# Patient Record
Sex: Female | Born: 1990 | Hispanic: Yes | Marital: Single | Smoking: Never smoker
Health system: Southern US, Community
[De-identification: ages and names within clinical notes are randomized; demographics above are authoritative.]

## PROBLEM LIST (undated history)

## (undated) ENCOUNTER — Inpatient Hospital Stay (HOSPITAL_COMMUNITY): Payer: Self-pay

## (undated) DIAGNOSIS — B999 Unspecified infectious disease: Secondary | ICD-10-CM

## (undated) DIAGNOSIS — L309 Dermatitis, unspecified: Secondary | ICD-10-CM

## (undated) DIAGNOSIS — F32A Depression, unspecified: Secondary | ICD-10-CM

## (undated) DIAGNOSIS — F329 Major depressive disorder, single episode, unspecified: Secondary | ICD-10-CM

## (undated) DIAGNOSIS — N390 Urinary tract infection, site not specified: Secondary | ICD-10-CM

## (undated) DIAGNOSIS — E119 Type 2 diabetes mellitus without complications: Secondary | ICD-10-CM

## (undated) HISTORY — DX: Dermatitis, unspecified: L30.9

## (undated) HISTORY — PX: BIOPSY BREAST: PRO8

## (undated) HISTORY — PX: WISDOM TOOTH EXTRACTION: SHX21

## (undated) HISTORY — DX: Type 2 diabetes mellitus without complications: E11.9

---
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
## 2010-01-07 ENCOUNTER — Inpatient Hospital Stay (HOSPITAL_COMMUNITY): Admission: AD | Admit: 2010-01-07 | Discharge: 2010-01-07 | Payer: Self-pay | Admitting: Obstetrics and Gynecology

## 2010-02-13 ENCOUNTER — Inpatient Hospital Stay (HOSPITAL_COMMUNITY): Admission: AD | Admit: 2010-02-13 | Discharge: 2010-02-13 | Payer: Self-pay | Admitting: Obstetrics and Gynecology

## 2010-02-23 ENCOUNTER — Inpatient Hospital Stay (HOSPITAL_COMMUNITY): Admission: AD | Admit: 2010-02-23 | Discharge: 2010-02-25 | Payer: Self-pay | Admitting: Obstetrics and Gynecology

## 2011-03-01 LAB — WET PREP, GENITAL
Clue Cells Wet Prep HPF POC: NONE SEEN
Trich, Wet Prep: NONE SEEN

## 2011-03-09 LAB — CBC
Hemoglobin: 11.3 g/dL — ABNORMAL LOW (ref 12.0–15.0)
MCHC: 33.2 g/dL (ref 30.0–36.0)
MCHC: 33.3 g/dL (ref 30.0–36.0)
MCV: 91.8 fL (ref 78.0–100.0)
Platelets: 146 10*3/uL — ABNORMAL LOW (ref 150–400)
RBC: 3.64 MIL/uL — ABNORMAL LOW (ref 3.87–5.11)
RBC: 4.27 MIL/uL (ref 3.87–5.11)
RDW: 13.2 % (ref 11.5–15.5)

## 2011-03-21 LAB — URINALYSIS, ROUTINE W REFLEX MICROSCOPIC
Hgb urine dipstick: NEGATIVE
Nitrite: NEGATIVE
Protein, ur: NEGATIVE mg/dL
Specific Gravity, Urine: 1.015 (ref 1.005–1.030)
Urobilinogen, UA: 0.2 mg/dL (ref 0.0–1.0)

## 2011-03-21 LAB — COMPREHENSIVE METABOLIC PANEL
ALT: 11 U/L (ref 0–35)
AST: 19 U/L (ref 0–37)
Albumin: 4 g/dL (ref 3.5–5.2)
Calcium: 9.5 mg/dL (ref 8.4–10.5)
GFR calc Af Amer: >60 mL/min (ref >60)
Glucose, Bld: 98 mg/dL (ref 70–99)
Sodium: 134 mEq/L — ABNORMAL LOW (ref 135–145)
Total Protein: 7.6 g/dL (ref 6.0–8.3)

## 2011-03-21 LAB — WET PREP, GENITAL

## 2011-03-21 LAB — CBC
Platelets: 175 10*3/uL (ref 150–400)
RBC: 3.91 MIL/uL (ref 3.87–5.11)
WBC: 9.5 10*3/uL (ref 4.0–10.5)

## 2011-03-21 LAB — GC/CHLAMYDIA PROBE AMP, GENITAL
Chlamydia, DNA Probe: NEGATIVE
GC Probe Amp, Genital: NEGATIVE

## 2011-03-26 LAB — GC/CHLAMYDIA PROBE AMP, URINE
Chlamydia, Swab/Urine, PCR: POSITIVE — AB
GC Probe Amp, Urine: NEGATIVE

## 2011-03-26 LAB — URINALYSIS, ROUTINE W REFLEX MICROSCOPIC
Bilirubin Urine: NEGATIVE
Glucose, UA: NEGATIVE mg/dL
Ketones, ur: 15 mg/dL — AB
Specific Gravity, Urine: >1.03 — ABNORMAL HIGH (ref 1.005–1.030)
pH: 6 (ref 5.0–8.0)

## 2011-03-26 LAB — URINE MICROSCOPIC-ADD ON

## 2011-03-26 LAB — POCT PREGNANCY, URINE: Preg Test, Ur: NEGATIVE

## 2011-04-27 ENCOUNTER — Emergency Department (HOSPITAL_COMMUNITY)
Admission: EM | Admit: 2011-04-27 | Discharge: 2011-04-27 | Disposition: A | Discharge status: Home / Self Care | Payer: Medicaid Other | Attending: Emergency Medicine | Admitting: Emergency Medicine

## 2011-04-27 DIAGNOSIS — J069 Acute upper respiratory infection, unspecified: Secondary | ICD-10-CM | POA: Insufficient documentation

## 2011-04-27 DIAGNOSIS — J029 Acute pharyngitis, unspecified: Secondary | ICD-10-CM | POA: Insufficient documentation

## 2011-04-27 LAB — RAPID STREP SCREEN (MED CTR MEBANE ONLY): Streptococcus, Group A Screen (Direct): NEGATIVE

## 2012-04-12 ENCOUNTER — Telehealth: Payer: Self-pay

## 2012-04-12 NOTE — Telephone Encounter (Signed)
Routed to triage 

## 2012-04-13 ENCOUNTER — Telehealth: Payer: Self-pay | Admitting: Obstetrics and Gynecology

## 2012-04-13 NOTE — Telephone Encounter (Signed)
Spoke with pt rgd msg. Pt stated she had the iud removed 01/18/12. She stated she had one missed cycle . Pt stated that took pregnancy test and it was neg. Advised pt to use back up method and told pt if no cycle by may 20 to take a pregnancy test. Pt stated she wants to get her cycle back to normal.pt does not want b/c at this time. bt cma

## 2012-04-13 NOTE — Telephone Encounter (Signed)
Lm on vm to cb per telephone call.  

## 2012-04-13 NOTE — Telephone Encounter (Signed)
Left msg on pt's voicemail to call bach rgd msg. bt cma

## 2012-05-20 ENCOUNTER — Ambulatory Visit: Payer: Self-pay

## 2012-06-21 ENCOUNTER — Ambulatory Visit: Payer: Medicaid Other

## 2012-07-19 ENCOUNTER — Ambulatory Visit: Payer: Self-pay

## 2012-11-30 ENCOUNTER — Ambulatory Visit (INDEPENDENT_AMBULATORY_CARE_PROVIDER_SITE_OTHER): Payer: Self-pay

## 2012-11-30 VITALS — BP 102/64 | Wt 123.0 lb

## 2012-11-30 DIAGNOSIS — R399 Unspecified symptoms and signs involving the genitourinary system: Secondary | ICD-10-CM

## 2012-11-30 DIAGNOSIS — B373 Candidiasis of vulva and vagina: Secondary | ICD-10-CM

## 2012-11-30 DIAGNOSIS — R3989 Other symptoms and signs involving the genitourinary system: Secondary | ICD-10-CM

## 2012-11-30 LAB — POCT URINALYSIS DIPSTICK
Spec Grav, UA: 1.015
pH, UA: 5

## 2012-11-30 MED ORDER — FLUCONAZOLE 200 MG PO TABS: 200.0000 mg | ORAL_TABLET | Freq: Once | ORAL | Status: DC

## 2012-11-30 NOTE — Progress Notes (Signed)
Contraception: condoms History of STD:  no history of PID, STD's History of ovarian cyst: no History of fibroids: no History of endometriosis:no Previous ultrasound:no  Urinary symptoms: urinary incontinence Gastro-intestinal symptoms:  Constipation: no     Diarrhea: no     Nausea: no     Vomiting: no     Fever: no Vaginal discharge: no vaginal discharge Pt stated she feels like she has a UTI symptoms with odor in urine    S:  21y.o. SHF P1001 who presents w/ CC of concentrated urine intermittently for several months and odor to urine.  Denies fever.  No pain w/ urination, sometimes urgency; no frequency.  Occ'l mid-back pain.  Drinks primarily water.  No contraception currently.  Took Depo 2/'13 and none since.  Using condoms, but isn't currently sexually active and declines contraception at present.  No GI c/o's.  Periods have been irregular, primarily longer lengths since DMPA.   O:  .Marland Kitchen Filed Vitals:   11/30/12 0845  BP: 102/64  Weight: 123 lb (55.792 kg)  .Marland Kitchen Results for orders placed in visit on 11/30/12 (from the past 24 hour(s))  POCT URINALYSIS DIPSTICK     Status: Normal   Collection Time   11/30/12  8:55 AM      Component Value Range   Color, UA yellow     Clarity, UA       Glucose, UA neg     Bilirubin, UA 1     Ketones, UA tr     Spec Grav, UA 1.015     Blood, UA tr     pH, UA 5.0     Protein, UA tr     Urobilinogen, UA negative     Nitrite, UA neg     Leukocytes, UA Trace    PE:  Gen: NAD, A&Ox3          Abd:  Soft, NT, no organomegaly          No CVAT today         Genitalia:  Very small amt of white d/c in vault; no irritation or inflammation.  No odor.  Ut nml size and shape.  No lesions.  EGBUS wnl; Adnexa WNL.  Wet prep:  Ph=4, yeast only on wet prep  A:  Yeast UTI suspected /vaginitis       No contraception and declines  P:  Urine sent for C&S; Diflucan 200mg  po x1 today (#2 RF); if s/s don't improve, refer to urology prn       Pap/Aex in Feb if  desires or May during birthday month

## 2012-12-02 LAB — URINE CULTURE: Colony Count: 9000

## 2013-01-09 ENCOUNTER — Telehealth: Payer: Self-pay | Admitting: Obstetrics and Gynecology

## 2013-01-09 NOTE — Telephone Encounter (Signed)
Tc to pt rgd lab results. Pt never received results of urine cx done 11/30/12. Advised pt that results were negative, pt states she is still having odor with her urine. Advised pt to increase water intake, try to drink cranberry juice, and to call the office back for an appointment if sxs still persist. Pt voiced understanding.

## 2013-01-30 ENCOUNTER — Telehealth: Payer: Self-pay | Admitting: Obstetrics and Gynecology

## 2013-01-31 ENCOUNTER — Encounter: Payer: Self-pay | Admitting: Family Medicine

## 2013-01-31 ENCOUNTER — Ambulatory Visit: Payer: Self-pay | Admitting: Family Medicine

## 2013-01-31 VITALS — BP 100/70 | Ht 65.0 in | Wt 127.0 lb

## 2013-01-31 DIAGNOSIS — N898 Other specified noninflammatory disorders of vagina: Secondary | ICD-10-CM

## 2013-01-31 DIAGNOSIS — Z124 Encounter for screening for malignant neoplasm of cervix: Secondary | ICD-10-CM

## 2013-01-31 LAB — POCT WET PREP (WET MOUNT)
Clue Cells Wet Prep Whiff POC: NEGATIVE
Trichomonas Wet Prep HPF POC: NEGATIVE
WBC, Wet Prep HPF POC: NEGATIVE
pH: 4.5

## 2013-01-31 NOTE — Telephone Encounter (Signed)
Pt states that she has had brown d/c with no odor. No other sx. Pt is not currently on BC. Had negative UPT on 01/26/13 Pt scheduled with LC, NP today to evaluate d/c. Was advised she would need to pay $150 at check-in  Darien Ramus, [place]

## 2013-01-31 NOTE — Patient Instructions (Signed)
1. Repeat home PT if no menses on 2/28. 2. Record monthly menses, if no menses in 3 months call office may need further evaluation.

## 2013-01-31 NOTE — Progress Notes (Signed)
Subjective:     Patient ID: Linda Huber, female   DOB: 1991/10/04, 22 y.o.   MRN: [id number] BP 100/70  Ht 5\' 5"  (1.651 m)  Wt 127 lb (57.607 kg)  BMI 21.13 kg/m2  LMP 01/10/2013   Gynecologic Exam The patient's primary symptoms include a vaginal discharge. This is a new problem. The current episode started in the past 7 days. The problem occurs rarely. The problem has been resolved. The patient is experiencing no pain. She is not pregnant. Pertinent negatives include no abdominal pain, nausea or vomiting. The vaginal discharge was bloody, clear and brown. There has been no bleeding. Nothing aggravates the symptoms. She has tried nothing for the symptoms. She is sexually active. No, her partner does not have an STD. She uses condoms for contraception. Her menstrual history has been regular.   Bleeding x 1 on 2/13. Then spotting. Condoms are used intermittently.  Home PT negative 2/13.  No vaginal itchy, irration.    Review of Systems  Constitutional: Negative for activity change and unexpected weight change.  HENT: Negative for neck pain and voice change.   Gastrointestinal: Negative for nausea, vomiting, abdominal pain and anal bleeding.  Endocrine: Negative.   Genitourinary: Positive for vaginal discharge. Negative for vaginal bleeding, vaginal pain, menstrual problem and dyspareunia.   No change in stress, exercise routine.  Denies history of BV, Yeast, STDs.    Objective:   Physical Exam  Constitutional: She appears well-developed and well-nourished.  Neck: Normal range of motion. No mass and no thyromegaly present.  Genitourinary: Uterus normal. Vaginal discharge found.  Lymphadenopathy:       Right: No inguinal adenopathy present.       Left: No inguinal adenopathy present.   Vagina: No hair present. WNL except small amount of thin, clear odorless vaginal discharge.  Cervix: WNL with a nabothain cyst at 11 o'clock.  NO CMT.  BSU: WNL. Adnexa: no tenderness or mass.       Assessment:    1. Wet Mount: negative, pH 4.5   2. Patient declines office PT today.   3. Pap with GC/CT culture collected .    Plan:   1. Leukorrhea: no treatment indicated.  2. Discussed with patient may have been irregular spotting with unknown cause vs. Change in                 menses.  Patient to continue monthly menses charting, if no menses on 2/28, need a home PT.  If      amenorrhea occurs for 3 months need to call the office for further work up.

## 2013-02-02 LAB — PAP IG, CT-NG, RFX HPV ASCU
Chlamydia Probe Amp: NEGATIVE
GC Probe Amp: NEGATIVE

## 2013-02-03 LAB — HUMAN PAPILLOMAVIRUS, HIGH RISK: HPV DNA High Risk: DETECTED — AB

## 2013-02-06 ENCOUNTER — Encounter: Payer: Self-pay | Admitting: Family Medicine

## 2013-02-22 ENCOUNTER — Telehealth: Payer: Self-pay | Admitting: Obstetrics and Gynecology

## 2013-02-22 NOTE — Telephone Encounter (Signed)
TC to pt Regarding pap results. LMOVM for pt to call back.   [facility] CMA

## 2013-12-01 ENCOUNTER — Emergency Department (HOSPITAL_COMMUNITY): Payer: 59

## 2013-12-01 ENCOUNTER — Encounter (HOSPITAL_COMMUNITY): Payer: Self-pay | Admitting: Emergency Medicine

## 2013-12-01 ENCOUNTER — Emergency Department (HOSPITAL_COMMUNITY)
Admission: EM | Admit: 2013-12-01 | Discharge: 2013-12-02 | Disposition: A | Discharge status: Home / Self Care | Payer: 59 | Attending: Emergency Medicine | Admitting: Emergency Medicine

## 2013-12-01 DIAGNOSIS — R0602 Shortness of breath: Secondary | ICD-10-CM | POA: Insufficient documentation

## 2013-12-01 DIAGNOSIS — R059 Cough, unspecified: Secondary | ICD-10-CM | POA: Insufficient documentation

## 2013-12-01 DIAGNOSIS — R509 Fever, unspecified: Secondary | ICD-10-CM | POA: Insufficient documentation

## 2013-12-01 DIAGNOSIS — J3489 Other specified disorders of nose and nasal sinuses: Secondary | ICD-10-CM | POA: Insufficient documentation

## 2013-12-01 DIAGNOSIS — J029 Acute pharyngitis, unspecified: Secondary | ICD-10-CM | POA: Insufficient documentation

## 2013-12-01 DIAGNOSIS — R110 Nausea: Secondary | ICD-10-CM | POA: Insufficient documentation

## 2013-12-01 DIAGNOSIS — R05 Cough: Secondary | ICD-10-CM

## 2013-12-01 NOTE — ED Provider Notes (Signed)
CSN: [id number]     Arrival date & time 12/01/13  2229 History   First MD Initiated Contact with Patient 12/01/13 2338     Chief Complaint  Patient presents with  . Cough   (Consider location/radiation/quality/duration/timing/severity/associated sxs/prior Treatment) Patient is a 22 y.o. female presenting with cough. The history is provided by the patient. No language interpreter was used.  Cough Cough characteristics:  Productive Sputum characteristics:  Clear and green Severity:  Mild Onset quality:  Gradual Duration:  4 weeks Timing:  Intermittent Progression:  Waxing and waning Chronicity:  New Smoker: no   Context: upper respiratory infection   Context: not smoke exposure   Relieved by:  Nothing Worsened by:  Deep breathing (worse at nighttime) Ineffective treatments:  Cough suppressants and fluids (cough drops) Associated symptoms: fever (subjective), shortness of breath (with prolonged coughing), sinus congestion and sore throat   Associated symptoms: no ear pain, no eye discharge, no myalgias, no rash and no rhinorrhea     History reviewed. No pertinent past medical history. Past Surgical History  Procedure Laterality Date  . Biopsy breast     Family History  Problem Relation Age of Onset  . Leukemia Paternal Grandmother   . Stroke Maternal Grandfather    History  Substance Use Topics  . Smoking status: Never Smoker   . Smokeless tobacco: Never Used  . Alcohol Use: Yes   OB History   Grav Para Term Preterm Abortions TAB SAB Ect Mult Living   1 1 1       1      Review of Systems  Constitutional: Positive for fever (subjective).  HENT: Positive for congestion, postnasal drip and sore throat. Negative for drooling, ear pain, rhinorrhea and trouble swallowing.   Eyes: Negative for discharge.  Respiratory: Positive for cough and shortness of breath (with prolonged coughing).   Gastrointestinal: Positive for nausea. Negative for vomiting.  Musculoskeletal:  Negative for myalgias.  Skin: Negative for rash.  All other systems reviewed and are negative.    Allergies  Review of patient's allergies indicates no known allergies.  Home Medications   Current Outpatient Rx  Name  Route  Sig  Dispense  Refill  . guaifenesin (ROBITUSSIN) 100 MG/5ML syrup   Oral   Take 200 mg by mouth 3 (three) times daily as needed for cough.         . benzonatate (TESSALON) 100 MG capsule   Oral   Take 2 capsules (200 mg total) by mouth 2 (two) times daily as needed for cough.   20 capsule   0   . fluticasone (FLONASE) 50 MCG/ACT nasal spray   Each Nare   Place 2 sprays into both nostrils daily.   16 g   0   . predniSONE (DELTASONE) 20 MG tablet   Oral   Take 2 tablets (40 mg total) by mouth daily.   10 tablet   0    BP 127/97  Pulse 73  Temp(Src) 97.7 F (36.5 C) (Oral)  Resp 18  SpO2 97%  LMP 11/23/2013  Physical Exam  Nursing note and vitals reviewed. Constitutional: She is oriented to person, place, and time. She appears well-developed and well-nourished. No distress.  Patient well and nontoxic appearing, in no acute distress. She is fully made up with make up as well as curled/styled hair.  HENT:  Head: Normocephalic and atraumatic.  Right Ear: Tympanic membrane, external ear and ear canal normal.  Left Ear: Tympanic membrane, external ear and ear canal  normal.  Nose: Right sinus exhibits frontal sinus tenderness. Right sinus exhibits no maxillary sinus tenderness. Left sinus exhibits frontal sinus tenderness. Left sinus exhibits no maxillary sinus tenderness.  Mouth/Throat: Uvula is midline, oropharynx is clear and moist and mucous membranes are normal. No oropharyngeal exudate.  Eyes: Conjunctivae and EOM are normal. Pupils are equal, round, and reactive to light. No scleral icterus.  Neck: Normal range of motion. Neck supple.  Cardiovascular: Normal rate, regular rhythm and normal heart sounds.   Pulmonary/Chest: Effort normal  and breath sounds normal. No stridor. No respiratory distress. She has no wheezes. She has no rales.  No retractions or accessory muscle use. Symmetric chest expansion.  Abdominal: Soft. She exhibits no distension. There is no tenderness. There is no rebound and no guarding.  Musculoskeletal: Normal range of motion.  Neurological: She is alert and oriented to person, place, and time.  Skin: Skin is warm and dry. No rash noted. She is not diaphoretic. No erythema. No pallor.  Psychiatric: She has a normal mood and affect. Her behavior is normal.    ED Course  Procedures (including critical care time) Labs Review Labs Reviewed - No data to display Imaging Review Dg Chest 2 View  12/02/2013   CLINICAL DATA:  Cough, fever and chills.  EXAM: CHEST  2 VIEW  COMPARISON:  None.  FINDINGS: The cardiomediastinal silhouette is unremarkable.  The lungs are clear.  There is no evidence of focal airspace disease, pulmonary edema, suspicious pulmonary nodule/mass, pleural effusion, or pneumothorax. No acute bony abnormalities are identified.  IMPRESSION: No active cardiopulmonary disease.   Electronically Signed   By: Laveda Abbe M.D.   On: 12/02/2013 00:00    EKG Interpretation   None       MDM   1. Cough    22 year old female presents for cough x1 month. Patient is well and nontoxic appearing, hemodynamically stable, and afebrile. Lungs clear to auscultation bilaterally. No retractions or accessory muscle use noted. Symmetric chest expansion. Uvula midline and patient tolerating secretions without difficulty. Chest x-ray negative for pneumonia or bronchitis. Doubt atypically presenting pulmonary embolism; patient PERC negative. Symptoms most c/w viral illness. Patient stable and appropriate for discharge with prescriptions for Flonase, Tessalon, and Prednisone. Return precautions provided and patient agreeable to plan with no unaddressed concerns.    Antony Madura, [place] 12/02/13 [phone]

## 2013-12-01 NOTE — ED Notes (Signed)
Pt. reports persistent cough for 1 month , denies SOB or chest pain , no fever or chills.

## 2013-12-02 MED ORDER — PREDNISONE 20 MG PO TABS: 40.0000 mg | ORAL_TABLET | Freq: Every day | ORAL | Status: DC

## 2013-12-02 MED ORDER — BENZONATATE 100 MG PO CAPS: 200.0000 mg | ORAL_CAPSULE | Freq: Two times a day (BID) | ORAL | Status: DC | PRN

## 2013-12-02 MED ORDER — FLUTICASONE PROPIONATE 50 MCG/ACT NA SUSP: 2.0000 | Freq: Every day | NASAL | Status: DC

## 2013-12-02 NOTE — ED Provider Notes (Signed)
Medical screening examination/treatment/procedure(s) were performed by non-physician practitioner and as supervising physician I was immediately available for consultation/collaboration.    Olivia Mackie, MD 12/02/13 [phone]

## 2013-12-14 NOTE — L&D Delivery Note (Signed)
Final Labor Progress Note At 1920 pt reports an increased in rectal pressure.  VE C/C/+2.  FHR remained reassuring with occasional variable decelerations with quick recovery to baseline at rest.     Vaginal Delivery Note The pt utilized an epidural as pain management.   Spontaneous rupture of membranes today, at 0700, clear.  GBS was unknown, Pt was treated prophylacticly with PCN x 3 doses were given.  Cervical dilation was complete at  1925.  NICHD Category 2.    Pushing with guidance began at  1930.   After 3 minutes of pushing the head, shoulders and the body of a viable female infant "[name]" delivered spontaneously with maternal effort in the LOA position at 1933.   Loose [place] x 1, easily reduced.   With vigorous tone and spontaneous cry, the infant was placed on moms abd.  After the umbilical cord was clamped it was cut by the FOB, then cord blood was obtained for evaluation.  Spontaneous delivery of a intact placenta with a 3 vessel cord via [place] at 1936.   Episiotomy: None   The vulva, perineum, vaginal vault, rectum and cervix were inspected, no repairs needed.   Postpartum pitocin as ordered.  Fundus firm, lochia minimum, bleeding under control.   EBL 200, Pt hemodynamically stable.   Sponge, laps and needle count correct and verified with the primary care nurse.  Attending MD available at all times.    Mom and baby were left in stable condition, baby skin to skin. Routine postpartum orders   Mother unsure about method of contraception Or Mother desires Mirena for contraception   Placenta to pathology: NO     Cord Gases sent to lab: NO Cord blood sent to lab: YES   APGARS:  9 at 1 minute and 9 at 5 minutes Weight:. TBD     Linda Huber, CNM, MSN 11/08/2014. 8:03 PM

## 2014-02-10 ENCOUNTER — Inpatient Hospital Stay (HOSPITAL_COMMUNITY)
Admission: AD | Admit: 2014-02-10 | Discharge: 2014-02-10 | Disposition: A | Discharge status: Home / Self Care | Payer: Self-pay | Source: Ambulatory Visit | Attending: Obstetrics and Gynecology | Admitting: Obstetrics and Gynecology

## 2014-02-10 ENCOUNTER — Encounter (HOSPITAL_COMMUNITY): Payer: Self-pay | Admitting: *Deleted

## 2014-02-10 ENCOUNTER — Inpatient Hospital Stay (HOSPITAL_COMMUNITY): Payer: Medicaid Other

## 2014-02-10 DIAGNOSIS — O200 Threatened abortion: Secondary | ICD-10-CM | POA: Insufficient documentation

## 2014-02-10 DIAGNOSIS — O2390 Unspecified genitourinary tract infection in pregnancy, unspecified trimester: Secondary | ICD-10-CM | POA: Insufficient documentation

## 2014-02-10 DIAGNOSIS — N390 Urinary tract infection, site not specified: Secondary | ICD-10-CM | POA: Insufficient documentation

## 2014-02-10 LAB — CBC
HCT: 35.5 % — ABNORMAL LOW (ref 36.0–46.0)
Hemoglobin: 12.3 g/dL (ref 12.0–15.0)
MCH: 29.4 pg (ref 26.0–34.0)
MCHC: 34.6 g/dL (ref 30.0–36.0)
MCV: 84.7 fL (ref 78.0–100.0)
Platelets: 201 10*3/uL (ref 150–400)
RBC: 4.19 MIL/uL (ref 3.87–5.11)
RDW: 12.8 % (ref 11.5–15.5)
WBC: 6.6 10*3/uL (ref 4.0–10.5)

## 2014-02-10 LAB — POCT PREGNANCY, URINE: Preg Test, Ur: POSITIVE — AB

## 2014-02-10 LAB — URINALYSIS, ROUTINE W REFLEX MICROSCOPIC
Bilirubin Urine: NEGATIVE
Glucose, UA: NEGATIVE mg/dL
KETONES UR: NEGATIVE mg/dL
LEUKOCYTES UA: NEGATIVE
NITRITE: POSITIVE — AB
PH: 6 (ref 5.0–8.0)
PROTEIN: NEGATIVE mg/dL
Specific Gravity, Urine: 1.015 (ref 1.005–1.030)
Urobilinogen, UA: 0.2 mg/dL (ref 0.0–1.0)

## 2014-02-10 LAB — WET PREP, GENITAL
Clue Cells Wet Prep HPF POC: NONE SEEN
Trich, Wet Prep: NONE SEEN
Yeast Wet Prep HPF POC: NONE SEEN

## 2014-02-10 LAB — ABO/RH: ABO/RH(D): AB POS

## 2014-02-10 LAB — URINE MICROSCOPIC-ADD ON

## 2014-02-10 LAB — HCG, QUANTITATIVE, PREGNANCY: HCG, BETA CHAIN, QUANT, S: 3844 m[IU]/mL — AB (ref <5)

## 2014-02-10 MED ORDER — OXYCODONE-ACETAMINOPHEN 5-325 MG PO TABS
1.0000 | ORAL_TABLET | Freq: Once | ORAL | Status: AC
  Administered 2014-02-10: 1 via ORAL
  Filled 2014-02-10: qty 1

## 2014-02-10 MED ORDER — OXYCODONE-ACETAMINOPHEN 5-325 MG PO TABS
2.0000 | ORAL_TABLET | ORAL | Status: DC | PRN
Start: 2014-02-10 — End: 2014-06-04

## 2014-02-10 MED ORDER — PROMETHAZINE HCL 25 MG PO TABS
25.0000 mg | ORAL_TABLET | Freq: Once | ORAL | Status: AC
  Administered 2014-02-10: 25 mg via ORAL
  Filled 2014-02-10: qty 1

## 2014-02-10 MED ORDER — PROMETHAZINE HCL 25 MG PO TABS: 25.0000 mg | ORAL_TABLET | Freq: Four times a day (QID) | ORAL | Status: DC | PRN

## 2014-02-10 MED ORDER — CEPHALEXIN 500 MG PO CAPS: 500.0000 mg | ORAL_CAPSULE | Freq: Four times a day (QID) | ORAL | Status: DC

## 2014-02-10 NOTE — Discharge Instructions (Signed)
Urinary Tract Infection Urinary tract infections (UTIs) can develop anywhere along your urinary tract. Your urinary tract is your body's drainage system for removing wastes and extra water. Your urinary tract includes two kidneys, two ureters, a bladder, and a urethra. Your kidneys are a pair of bean-shaped organs. Each kidney is about the size of your fist. They are located below your ribs, one on each side of your spine. CAUSES Infections are caused by microbes, which are microscopic organisms, including fungi, viruses, and bacteria. These organisms are so small that they can only be seen through a microscope. Bacteria are the microbes that most commonly cause UTIs. SYMPTOMS  Symptoms of UTIs may vary by age and gender of the patient and by the location of the infection. Symptoms in young women typically include a frequent and intense urge to urinate and a painful, burning feeling in the bladder or urethra during urination. Older women and men are more likely to be tired, shaky, and weak and have muscle aches and abdominal pain. A fever may mean the infection is in your kidneys. Other symptoms of a kidney infection include pain in your back or sides below the ribs, nausea, and vomiting. DIAGNOSIS To diagnose a UTI, your caregiver will ask you about your symptoms. Your caregiver also will ask to provide a urine sample. The urine sample will be tested for bacteria and white blood cells. White blood cells are made by your body to help fight infection. TREATMENT  Typically, UTIs can be treated with medication. Because most UTIs are caused by a bacterial infection, they usually can be treated with the use of antibiotics. The choice of antibiotic and length of treatment depend on your symptoms and the type of bacteria causing your infection. HOME CARE INSTRUCTIONS  If you were prescribed antibiotics, take them exactly as your caregiver instructs you. Finish the medication even if you feel better after you  have only taken some of the medication.  Drink enough water and fluids to keep your urine clear or pale yellow.  Avoid caffeine, tea, and carbonated beverages. They tend to irritate your bladder.  Empty your bladder often. Avoid holding urine for long periods of time.  Empty your bladder before and after sexual intercourse.  After a bowel movement, women should cleanse from front to back. Use each tissue only once. SEEK MEDICAL CARE IF:   You have back pain.  You develop a fever.  Your symptoms do not begin to resolve within 3 days. SEEK IMMEDIATE MEDICAL CARE IF:   You have severe back pain or lower abdominal pain.  You develop chills.  You have nausea or vomiting.  You have continued burning or discomfort with urination. MAKE SURE YOU:   Understand these instructions.  Will watch your condition.  Will get help right away if you are not doing well or get worse. Document Released: 09/09/2005 Document Revised: 05/31/2012 Document Reviewed: 01/08/2012 [facility] Patient Information 2014 [place], [place]. Threatened Miscarriage Bleeding during the first 20 weeks of pregnancy is common. This is sometimes called a threatened miscarriage. This is a pregnancy that is threatening to end before the twentieth week of pregnancy. Often this bleeding stops with bed rest or decreased activities as suggested by your caregiver and the pregnancy continues without any more problems. You may be asked to not have sexual intercourse, have orgasms or use tampons until further notice. Sometimes a threatened miscarriage can progress to a complete or incomplete miscarriage. This may or may not require further treatment. Some miscarriages  occur before a woman misses a menstrual period and knows she is pregnant. Miscarriages occur in 15 to 20% of all pregnancies and usually occur during the first 13 weeks of the pregnancy. The exact cause of a miscarriage is usually never known. A miscarriage is natures way  of ending a pregnancy that is abnormal or would not make it to term. There are some things that may put you at risk to have a miscarriage, such as:  Hormone problems.  Infection of the uterus or cervix.  Chronic illness, diabetes for example, especially if it is not controlled.  Abnormal shaped uterus.  Fibroids in the uterus.  Incompetent cervix (the cervix is too weak to hold the baby).  Smoking.  Drinking too much alcohol. It's best not to drink any alcohol when you are pregnant.  Taking illegal drugs. TREATMENT  When a miscarriage becomes complete and all products of conception (all the tissue in the uterus) have been passed, often no treatment is needed. If you think you passed tissue, save it in a container and take it to your doctor for evaluation. If the miscarriage is incomplete (parts of the fetus or placenta remain in the uterus), further treatment may be needed. The most common reason for further treatment is continued bleeding (hemorrhage) because pregnancy tissue did not pass out of the uterus. This often occurs if a miscarriage is incomplete. Tissue left behind may also become infected. Treatment usually is dilatation and curettage (the removal of the remaining products of pregnancy. This can be done by a simple sucking procedure (suction curettage) or a simple scraping of the inside of the uterus. This may be done in the hospital or in the caregiver's office. This is only done when your caregiver knows that there is no chance for the pregnancy to proceed to term. This is determined by physical examination, negative pregnancy test, falling pregnancy hormone count and/or, an ultrasound revealing a dead fetus. Miscarriages are often a very emotional time for prospective mothers and fathers. This is not you or your partners fault. It did not occur because of an inadequacy in you or your partner. Nearly all miscarriages occur because the pregnancy has started off wrongly. At least  half of these pregnancies have a chromosomal abnormality. It is almost always not inherited. Others may have developmental problems with the fetus or placenta. This does not always show up even when the products miscarried are studied under the microscope. The miscarriage is nearly always not your fault and it is not likely that you could have prevented it from happening. If you are having emotional and grieving problems, talk to your health care provider and even seek counseling, if necessary, before getting pregnant again. You can begin trying for another pregnancy as soon as your caregiver says it is OK. HOME CARE INSTRUCTIONS   Your caregiver may order bed rest depending on how much bleeding and cramping you are having. You may be limited to only getting up to go to the bathroom. You may be allowed to continue light activity. You may need to make arrangements for the care of your other children and for any other responsibilities.  Keep track of the number of pads you use each day, how often you have to change pads and how saturated (soaked) they are. Record this information.  DO NOT USE TAMPONS. Do not douche, have sexual intercourse or orgasms until approved by your caregiver.  You may receive a follow up appointment for re-evaluation of your pregnancy and  a repeat blood test. Re-evaluation often occurs after 2 days and again in 4 to 6 weeks. It is very important that you follow-up in the recommended time period.  If you are Rh negative and the father is Rh positive or you do not know the fathers' blood type, you may receive a shot (Rh immune globulin) to help prevent abnormal antibodies that can develop and affect the baby in any future pregnancies. SEEK IMMEDIATE MEDICAL CARE IF:  You have severe cramps in your stomach, back, or abdomen.  You have a sudden onset of severe pain in the lower part of your abdomen.  You develop chills.  You run an unexplained temperature of 101 F (38.3 C) or  higher.  You pass large clots or tissue. Save any tissue for your caregiver to inspect.  Your bleeding increases or you become light-headed, weak, or have fainting episodes.  You have a gush of fluid from your vagina.  You pass out. This could mean you have a tubal (ectopic) pregnancy. Document Released: 11/30/2005 Document Revised: 02/22/2012 Document Reviewed: 07/16/2008 [facility] Patient Information 2014 [place], [place].

## 2014-02-10 NOTE — MAU Provider Note (Signed)
History     CSN: [id number]  Arrival date and time: 02/10/14 1421   First Provider Initiated Contact with Patient 02/10/14 1502      Chief Complaint  Patient presents with  . Vaginal Bleeding   HPI Comments: LEKIA NIER 23 y.o. G2P1001 presents to MAU for bleeding X 1 day. She is also experiencing cramping that is " 4 " on 1/10 scale. She has not had any recent intercourse and no partner change.   Vaginal Bleeding      History reviewed. No pertinent past medical history.  Past Surgical History  Procedure Laterality Date  . Biopsy breast      Family History  Problem Relation Age of Onset  . Leukemia Paternal Grandmother   . Stroke Maternal Grandfather     History  Substance Use Topics  . Smoking status: Never Smoker   . Smokeless tobacco: Never Used  . Alcohol Use: Yes    Allergies: No Known Allergies  No prescriptions prior to admission    Review of Systems  Constitutional: Negative.   HENT: Negative.   Eyes: Negative.   Respiratory: Negative.   Cardiovascular: Negative.   Gastrointestinal: Negative.   Genitourinary: Positive for vaginal bleeding.       Vaginal bleeding and cramping  Musculoskeletal: Negative.   Skin: Negative.   Neurological: Negative.   Endo/Heme/Allergies: Negative.   Psychiatric/Behavioral: Negative.    Physical Exam   Blood pressure 121/74, pulse 81, temperature 97.7 F (36.5 C), temperature source Oral, resp. rate 18, last menstrual period 12/24/2013.  Physical Exam  Constitutional: She is oriented to person, place, and time. She appears well-developed and well-nourished. No distress.  HENT:  Head: Normocephalic and atraumatic.  Eyes: Pupils are equal, round, and reactive to light.  Neck: Normal range of motion.  Cardiovascular: Normal rate, regular rhythm and normal heart sounds.   Respiratory: Effort normal and breath sounds normal.  GI: Soft. She exhibits no distension. There is tenderness.  Genitourinary:   Genital: External negative Vaginal: Moderate amount blood with clots Cervix:closed/ long Bimanual:Tender uterus   Musculoskeletal: Normal range of motion.  Neurological: She is alert and oriented to person, place, and time.  Skin: Skin is warm and dry.  Psychiatric: She has a normal mood and affect. Her behavior is normal. Judgment and thought content normal.   Results for orders placed during the hospital encounter of 02/10/14 (from the past 24 hour(s))  URINALYSIS, ROUTINE W REFLEX MICROSCOPIC     Status: Abnormal   Collection Time    02/10/14  2:45 PM      Result Value Ref Range   Color, Urine YELLOW  YELLOW   APPearance CLEAR  CLEAR   Specific Gravity, Urine 1.015  1.005 - 1.030   pH 6.0  5.0 - 8.0   Glucose, UA NEGATIVE  NEGATIVE mg/dL   Hgb urine dipstick LARGE (*) NEGATIVE   Bilirubin Urine NEGATIVE  NEGATIVE   Ketones, ur NEGATIVE  NEGATIVE mg/dL   Protein, ur NEGATIVE  NEGATIVE mg/dL   Urobilinogen, UA 0.2  0.0 - 1.0 mg/dL   Nitrite POSITIVE (*) NEGATIVE   Leukocytes, UA NEGATIVE  NEGATIVE  URINE MICROSCOPIC-ADD ON     Status: Abnormal   Collection Time    02/10/14  2:45 PM      Result Value Ref Range   Squamous Epithelial / LPF FEW (*) RARE   RBC / HPF 3-6  <3 RBC/hpf   Bacteria, UA MANY (*) RARE   Urine-Other MUCOUS PRESENT  POCT PREGNANCY, URINE     Status: Abnormal   Collection Time    02/10/14  2:58 PM      Result Value Ref Range   Preg Test, Ur POSITIVE (*) NEGATIVE  WET PREP, GENITAL     Status: Abnormal   Collection Time    02/10/14  3:25 PM      Result Value Ref Range   Yeast Wet Prep HPF POC NONE SEEN  NONE SEEN   Trich, Wet Prep NONE SEEN  NONE SEEN   Clue Cells Wet Prep HPF POC NONE SEEN  NONE SEEN   WBC, Wet Prep HPF POC FEW (*) NONE SEEN  CBC     Status: Abnormal   Collection Time    02/10/14  3:41 PM      Result Value Ref Range   WBC 6.6  4.0 - 10.5 K/uL   RBC 4.19  3.87 - 5.11 MIL/uL   Hemoglobin 12.3  12.0 - 15.0 g/dL   HCT 09.8 (*)  11.9 - 46.0 %   MCV 84.7  78.0 - 100.0 fL   MCH 29.4  26.0 - 34.0 pg   MCHC 34.6  30.0 - 36.0 g/dL   RDW 14.7  82.9 - 56.2 %   Platelets 201  150 - 400 K/uL  HCG, QUANTITATIVE, PREGNANCY     Status: Abnormal   Collection Time    02/10/14  3:41 PM      Result Value Ref Range   hCG, Beta Chain, Quant, S 3844 (*) <5 mIU/mL  ABO/RH     Status: None   Collection Time    02/10/14  3:41 PM      Result Value Ref Range   ABO/RH(D) AB POS     US Ob Comp Less 14 Wks  02/10/2014   CLINICAL DATA:  Bleeding and cramping. Pregnant. Beta HCG level 3844  EXAM: OBSTETRIC <14 WK [place] AND TRANSVAGINAL OB US  TECHNIQUE: Both transabdominal and transvaginal ultrasound examinations were performed for complete evaluation of the gestation as well as the maternal uterus, adnexal regions, and pelvic cul-de-sac. Transvaginal technique was performed to assess early pregnancy.  COMPARISON:  None.  FINDINGS: Intrauterine gestational sac: Visualized/normal in shape.  Yolk sac:  Not visualized  Embryo:  Not visualized  MSD:  6.9  mm   5 w   2  d  [place] EDC: 10/11/2014  Maternal uterus/adnexae: Small to moderate subchorionic hemorrhage lies along the margin of the gestational sac. No uterine masses. Cervix is closed. Normal ovaries. No pelvic free fluid.  IMPRESSION: 1. Intrauterine gestational sac is consistent with an early intrauterine pregnancy. No yolk sac or embryo is seen. There is a small to moderate subchorionic hemorrhage. No other evidence of a pregnancy complication. 2. Normal ovaries and adnexa.  No pelvic free fluid. 3. Recommend followup ultrasound in 7-10 days to document normal pregnancy progression.   Electronically Signed   By: Amie Portland M.D.   On: 02/10/2014 18:19   US Ob Transvaginal  02/10/2014   CLINICAL DATA:  Bleeding and cramping. Pregnant. Beta HCG level 3844  EXAM: OBSTETRIC <14 WK [place] AND TRANSVAGINAL OB US  TECHNIQUE: Both transabdominal and transvaginal ultrasound examinations were performed for  complete evaluation of the gestation as well as the maternal uterus, adnexal regions, and pelvic cul-de-sac. Transvaginal technique was performed to assess early pregnancy.  COMPARISON:  None.  FINDINGS: Intrauterine gestational sac: Visualized/normal in shape.  Yolk sac:  Not visualized  Embryo:  Not visualized  MSD:  6.9  mm   5 w   2  d  US EDC: 10/11/2014  Maternal uterus/adnexae: Small to moderate subchorionic hemorrhage lies along the margin of the gestational sac. No uterine masses. Cervix is closed. Normal ovaries. No pelvic free fluid.  IMPRESSION: 1. Intrauterine gestational sac is consistent with an early intrauterine pregnancy. No yolk sac or embryo is seen. There is a small to moderate subchorionic hemorrhage. No other evidence of a pregnancy complication. 2. Normal ovaries and adnexa.  No pelvic free fluid. 3. Recommend followup ultrasound in 7-10 days to document normal pregnancy progression.   Electronically Signed   By: Amie Portlandavid  Ormond M.D.   On: 02/10/2014 18:19     MAU Course  Procedures  MDM  Wet prep, GC, Chlamydia, CBC, UA, U/S, ABORh, Quant Percocet/ phenergan Assessment and Plan   A: Threatened Miscarriage UTI  P: Miscarriage precautions Pelvic rest/ increase fluids Keflex 500mg  QID x 5 days Phenergan 25 mg po q6 hrs Percocet 325/5 mg po q 4 hours Return to MAU in 48 hours  Carolynn ServeBarefoot, Wes Lezotte Miller 02/10/2014, 3:40 PM

## 2014-02-10 NOTE — MAU Note (Signed)
Pt presents with complaints of vaginal bleeding that started yesterday and had a HPT that was + on February the 15th.

## 2014-02-10 NOTE — MAU Provider Note (Signed)
Attestation of Attending Supervision of Advanced Practitioner: Evaluation and management procedures were performed by the PA/NP/CNM/OB Fellow under my supervision/collaboration. Chart reviewed and agree with management and plan.  Caiden Arteaga V 02/10/2014 7:46 PM   

## 2014-02-12 ENCOUNTER — Encounter (HOSPITAL_COMMUNITY): Payer: Self-pay | Admitting: *Deleted

## 2014-02-12 ENCOUNTER — Inpatient Hospital Stay (HOSPITAL_COMMUNITY)
Admission: AD | Admit: 2014-02-12 | Discharge: 2014-02-12 | Disposition: A | Discharge status: Home / Self Care | Payer: 59 | Source: Ambulatory Visit | Attending: Obstetrics & Gynecology | Admitting: Obstetrics & Gynecology

## 2014-02-12 DIAGNOSIS — R109 Unspecified abdominal pain: Secondary | ICD-10-CM | POA: Insufficient documentation

## 2014-02-12 DIAGNOSIS — O039 Complete or unspecified spontaneous abortion without complication: Secondary | ICD-10-CM | POA: Insufficient documentation

## 2014-02-12 HISTORY — DX: Unspecified infectious disease: B99.9

## 2014-02-12 HISTORY — DX: Depression, unspecified: F32.A

## 2014-02-12 HISTORY — DX: Major depressive disorder, single episode, unspecified: F32.9

## 2014-02-12 LAB — CBC
HCT: 36.4 % (ref 36.0–46.0)
Hemoglobin: 12.7 g/dL (ref 12.0–15.0)
MCH: 29.9 pg (ref 26.0–34.0)
MCHC: 34.9 g/dL (ref 30.0–36.0)
MCV: 85.6 fL (ref 78.0–100.0)
PLATELETS: 196 10*3/uL (ref 150–400)
RBC: 4.25 MIL/uL (ref 3.87–5.11)
RDW: 12.8 % (ref 11.5–15.5)
WBC: 7.5 10*3/uL (ref 4.0–10.5)

## 2014-02-12 LAB — GC/CHLAMYDIA PROBE AMP
CT Probe RNA: NEGATIVE
GC PROBE AMP APTIMA: NEGATIVE

## 2014-02-12 LAB — HCG, QUANTITATIVE, PREGNANCY: hCG, Beta Chain, Quant, S: 1112 m[IU]/mL — ABNORMAL HIGH (ref <5)

## 2014-02-12 MED ORDER — IBUPROFEN 600 MG PO TABS
600.0000 mg | ORAL_TABLET | Freq: Once | ORAL | Status: AC
  Administered 2014-02-12: 600 mg via ORAL
  Filled 2014-02-12: qty 1

## 2014-02-12 NOTE — MAU Provider Note (Signed)
HPI:  Ms. Linda Huber is a 23 y.o. female who presents for a follow up beta hcg level. In triage patient states that her vaginal bleeding is worse than what she was experiencing a few days ago when she was here. Pt feels at times she feels dizzy at times and continues to have lower abdominal cramping. Pt took a "pain pill" prior to coming into MAU.  Her beta hcg level on 2/28 was 3844.    Objective:  GENERAL: Well-developed, well-nourished female in no acute distress. She appears anxious  HEENT: Normocephalic, atraumatic.   LUNGS: Effort normal HEART: Regular rate  ABDOMEN: soft, non-tender, no guarding  SKIN: Warm, dry and without erythema PSYCH: Normal mood and affect    Filed Vitals:   02/12/14 1626  BP: 132/102  Pulse: 92  Temp:   Resp: 16   Results for orders placed during the hospital encounter of 02/12/14 (from the past 48 hour(s))  HCG, QUANTITATIVE, PREGNANCY     Status: Abnormal   Collection Time    02/12/14  2:02 PM      Result Value Ref Range   hCG, Beta Chain, Quant, S 1112 (*) <5 mIU/mL   Comment:              GEST. AGE      CONC.  (mIU/mL)       <=1 WEEK        5 - 50         2 WEEKS       50 - 500         3 WEEKS       100 - 10,000         4 WEEKS     1,000 - 30,000         5 WEEKS     3,500 - 115,000       6-8 WEEKS     12,000 - 270,000        12 WEEKS     15,000 - 220,000                FEMALE AND NON-PREGNANT FEMALE:         LESS THAN 5 mIU/mL     [facility] [place]  CBC     Status: None   Collection Time    02/12/14  2:02 PM      Result Value Ref Range   WBC 7.5  4.0 - 10.5 K/uL   RBC 4.25  3.87 - 5.11 MIL/uL   Hemoglobin 12.7  12.0 - 15.0 g/dL   HCT 16.136.4  09.636.0 - 04.546.0 %   MCV 85.6  78.0 - 100.0 fL   MCH 29.9  26.0 - 34.0 pg   MCHC 34.9  30.0 - 36.0 g/dL   RDW 40.912.8  81.111.5 - 91.415.5 %   Platelets 196  150 - 400 K/uL    MDM CBC Beta hcg Orthostatic vitals Ibuprofen  Pt currently denies pain.   A:  SAB in process  Vaginal  bleeding in pregnancy  P:  Discharge home in stable condition Follow up in the clinic in one week Bleeding precautions discussed Pelvic rest Support given   Iona HansenJennifer Irene Rasch, NP 02/12/2014 4:58 PM

## 2014-02-12 NOTE — MAU Note (Signed)
Pain and bleeding got worse yesterday.  Has passed a few clots. Is taking pain medication.

## 2014-02-12 NOTE — MAU Provider Note (Signed)
Attestation of Attending Supervision of Advanced Practitioner (CNM/NP): Evaluation and management procedures were performed by the Advanced Practitioner under my supervision and collaboration.  I have reviewed the Advanced Practitioner's note and chart, and I agree with the management and plan.  HARRAWAY-SMITH, Chidi Shirer 5:44 PM

## 2014-02-12 NOTE — MAU Note (Signed)
Patient to MAU for follow up BHCG. Patient states she is having a lot of cramping and a lot of bleeding, more than a period.

## 2014-02-12 NOTE — Discharge Instructions (Signed)
Miscarriage  A miscarriage is the loss of an unborn baby (fetus) before the 20th week of pregnancy. The cause is often unknown.   HOME CARE  · You may need to stay in bed (bed rest), or you may be able to do light activity. Go about activity as told by your doctor.  · Have help at home.  · Write down how many pads you use each day. Write down how soaked they are.  · Do not use tampons. Do not wash out your vagina (douche) or have sex (intercourse) until your doctor approves.  · Only take medicine as told by your doctor.  · Do not take aspirin.  · Keep all doctor visits as told.  · If you or your partner have problems with grieving, talk to your doctor. You can also try counseling. Give yourself time to grieve before trying to get pregnant again.  GET HELP RIGHT AWAY IF:  · You have bad cramps or pain in your back or belly (abdomen).  · You have a fever.  · You pass large clumps of blood (clots) from your vagina that are walnut-sized or larger. Save the clumps for your doctor to see.  · You pass large amounts of tissue from your vagina. Save the tissue for your doctor to see.  · You have more bleeding.  · You have thick, bad-smelling fluid (discharge) coming from the vagina.  · You get lightheaded, weak, or you pass out (faint).  · You have chills.  MAKE SURE YOU:  · Understand these instructions.  · Will watch your condition.  · Will get help right away if you are not doing well or get worse.  Document Released: 02/22/2012 Document Reviewed: 02/22/2012  ExitCare® Patient Information ©2014 [employer].

## 2014-02-12 NOTE — MAU Note (Signed)
Patient not in the lobby when called to triage.

## 2014-02-26 ENCOUNTER — Encounter: Payer: Self-pay | Admitting: *Deleted

## 2014-02-26 ENCOUNTER — Encounter: Payer: Self-pay | Admitting: Family Medicine

## 2014-02-26 ENCOUNTER — Telehealth: Payer: Self-pay | Admitting: *Deleted

## 2014-02-26 NOTE — Telephone Encounter (Signed)
Linda Huber did not keep appointment for follow up after sab.  Called Linda Huber and left a message you missed your appointment and need to reschedule. Please call clinic and reschedule.

## 2014-02-26 NOTE — Telephone Encounter (Signed)
Will send letter to reschedule missed appointment for sab followup

## 2014-04-09 LAB — OB RESULTS CONSOLE GC/CHLAMYDIA
Chlamydia: NEGATIVE
Gonorrhea: NEGATIVE

## 2014-04-09 LAB — OB RESULTS CONSOLE RUBELLA ANTIBODY, IGM: Rubella: IMMUNE

## 2014-04-09 LAB — OB RESULTS CONSOLE RPR: RPR: NONREACTIVE

## 2014-04-09 LAB — OB RESULTS CONSOLE ANTIBODY SCREEN: ANTIBODY SCREEN: NEGATIVE

## 2014-04-09 LAB — OB RESULTS CONSOLE HEPATITIS B SURFACE ANTIGEN: HEP B S AG: NEGATIVE

## 2014-04-09 LAB — OB RESULTS CONSOLE HIV ANTIBODY (ROUTINE TESTING): HIV: NONREACTIVE

## 2014-06-04 ENCOUNTER — Encounter (HOSPITAL_COMMUNITY): Payer: Self-pay | Admitting: *Deleted

## 2014-06-04 ENCOUNTER — Inpatient Hospital Stay (HOSPITAL_COMMUNITY)
Admission: AD | Admit: 2014-06-04 | Discharge: 2014-06-05 | Disposition: A | Discharge status: Home / Self Care | Payer: Medicaid Other | Source: Ambulatory Visit | Attending: Obstetrics and Gynecology | Admitting: Obstetrics and Gynecology

## 2014-06-04 DIAGNOSIS — D72829 Elevated white blood cell count, unspecified: Secondary | ICD-10-CM | POA: Insufficient documentation

## 2014-06-04 DIAGNOSIS — R109 Unspecified abdominal pain: Secondary | ICD-10-CM | POA: Insufficient documentation

## 2014-06-04 DIAGNOSIS — O99891 Other specified diseases and conditions complicating pregnancy: Secondary | ICD-10-CM | POA: Insufficient documentation

## 2014-06-04 DIAGNOSIS — O9989 Other specified diseases and conditions complicating pregnancy, childbirth and the puerperium: Principal | ICD-10-CM

## 2014-06-04 DIAGNOSIS — M549 Dorsalgia, unspecified: Secondary | ICD-10-CM | POA: Insufficient documentation

## 2014-06-04 HISTORY — DX: Urinary tract infection, site not specified: N39.0

## 2014-06-04 LAB — WET PREP, GENITAL
CLUE CELLS WET PREP: NONE SEEN
TRICH WET PREP: NONE SEEN
Yeast Wet Prep HPF POC: NONE SEEN

## 2014-06-04 LAB — URINALYSIS, ROUTINE W REFLEX MICROSCOPIC
BILIRUBIN URINE: NEGATIVE
Glucose, UA: NEGATIVE mg/dL
Ketones, ur: NEGATIVE mg/dL
NITRITE: NEGATIVE
PROTEIN: NEGATIVE mg/dL
Specific Gravity, Urine: <1.005 — ABNORMAL LOW (ref 1.005–1.030)
Urobilinogen, UA: 0.2 mg/dL (ref 0.0–1.0)
pH: 6.5 (ref 5.0–8.0)

## 2014-06-04 LAB — URINE MICROSCOPIC-ADD ON

## 2014-06-04 MED ORDER — OXYCODONE-ACETAMINOPHEN 5-325 MG PO TABS
1.0000 | ORAL_TABLET | Freq: Once | ORAL | Status: AC
Start: 2014-06-04 — End: 2014-06-04
  Administered 2014-06-04: 1 via ORAL
  Filled 2014-06-04: qty 1

## 2014-06-04 MED ORDER — PHENAZOPYRIDINE HCL 100 MG PO TABS
100.0000 mg | ORAL_TABLET | Freq: Once | ORAL | Status: AC
  Administered 2014-06-04: 100 mg via ORAL
  Filled 2014-06-04: qty 1

## 2014-06-04 MED ORDER — IBUPROFEN 600 MG PO TABS
600.0000 mg | ORAL_TABLET | Freq: Once | ORAL | Status: AC
  Administered 2014-06-04: 600 mg via ORAL
  Filled 2014-06-04: qty 1

## 2014-06-04 NOTE — MAU Note (Signed)
PT SAYS SHE STARTED HAVING ABD PAIN   THIS AFTERNOON.   SHE WENT TO OFFICE THIS AM- COLLECTED UA-  HURTS TO VOID  AND BACK HURTS-  HAS AN APPOINTMENT  ON 7-6 WITH SPECIALIST -  HAS REPEATED UTI.   NO MEDS TODAY.   NO PAIN THIS AM.    LAST SEX- 1 WEEK AGO.

## 2014-06-05 LAB — COMPREHENSIVE METABOLIC PANEL
ALBUMIN: 3.3 g/dL — AB (ref 3.5–5.2)
ALK PHOS: 37 U/L — AB (ref 39–117)
ALT: 9 U/L (ref 0–35)
AST: 13 U/L (ref 0–37)
BUN: 11 mg/dL (ref 6–23)
CHLORIDE: 103 meq/L (ref 96–112)
CO2: 20 mEq/L (ref 19–32)
Calcium: 9 mg/dL (ref 8.4–10.5)
Creatinine, Ser: 0.52 mg/dL (ref 0.50–1.10)
GFR calc Af Amer: >90 mL/min (ref >90)
GFR calc non Af Amer: >90 mL/min (ref >90)
Glucose, Bld: 86 mg/dL (ref 70–99)
POTASSIUM: 3.7 meq/L (ref 3.7–5.3)
SODIUM: 136 meq/L — AB (ref 137–147)
Total Protein: 6.7 g/dL (ref 6.0–8.3)

## 2014-06-05 LAB — CBC WITH DIFFERENTIAL/PLATELET
BASOS ABS: 0 10*3/uL (ref 0.0–0.1)
BASOS PCT: 0 % (ref 0–1)
Eosinophils Absolute: 0.3 10*3/uL (ref 0.0–0.7)
Eosinophils Relative: 2 % (ref 0–5)
HCT: 32.8 % — ABNORMAL LOW (ref 36.0–46.0)
Hemoglobin: 11.8 g/dL — ABNORMAL LOW (ref 12.0–15.0)
Lymphocytes Relative: 15 % (ref 12–46)
Lymphs Abs: 2.3 10*3/uL (ref 0.7–4.0)
MCH: 30.5 pg (ref 26.0–34.0)
MCHC: 36 g/dL (ref 30.0–36.0)
MCV: 84.8 fL (ref 78.0–100.0)
Monocytes Absolute: 1.1 10*3/uL — ABNORMAL HIGH (ref 0.1–1.0)
Monocytes Relative: 7 % (ref 3–12)
NEUTROS ABS: 11.1 10*3/uL — AB (ref 1.7–7.7)
NEUTROS PCT: 76 % (ref 43–77)
Platelets: 178 10*3/uL (ref 150–400)
RBC: 3.87 MIL/uL (ref 3.87–5.11)
RDW: 12.7 % (ref 11.5–15.5)
WBC: 14.7 10*3/uL — ABNORMAL HIGH (ref 4.0–10.5)

## 2014-06-05 LAB — GC/CHLAMYDIA PROBE AMP
CT Probe RNA: NEGATIVE
GC Probe RNA: NEGATIVE

## 2014-06-05 MED ORDER — PHENAZOPYRIDINE HCL 100 MG PO TABS: 100.0000 mg | ORAL_TABLET | Freq: Three times a day (TID) | ORAL | Status: DC | PRN

## 2014-06-05 MED ORDER — OXYCODONE-ACETAMINOPHEN 5-325 MG PO TABS: 1.0000 | ORAL_TABLET | ORAL | Status: DC | PRN

## 2014-06-05 MED ORDER — IBUPROFEN 200 MG PO TABS: 600.0000 mg | ORAL_TABLET | Freq: Four times a day (QID) | ORAL | Status: DC

## 2014-06-05 NOTE — MAU Provider Note (Signed)
  History     CSN: [id number]  Arrival date and time: 06/04/14 2028   None     No chief complaint on file.  HPI Comments: Pt is a G2P1 at 7143w4d arrives w c/o suprapubic pain and back pain, onset this afternoon, took 1 dose of tylenol without relief. Pt has hx persistent e-coli UTI, finished 4th round of ABX about 2wks ago. Reports increased "pressure" w urination, no hematuria, no fever, denies N/V. No bleeding or menstrual like cramps, no discharge.      Past Medical History  Diagnosis Date  . Infection     UTI  . Depression     not on meds, doing good  . Urinary tract infection     Past Surgical History  Procedure Laterality Date  . Biopsy breast      Family History  Problem Relation Age of Onset  . Leukemia Paternal Grandmother   . Stroke Maternal Grandfather   . Asthma Mother   . Hypertension Mother   . Cancer Neg Hx   . Heart disease Neg Hx     History  Substance Use Topics  . Smoking status: Never Smoker   . Smokeless tobacco: Never Used  . Alcohol Use: Yes     Comment: not with + preg    Allergies: No Known Allergies  Prescriptions prior to admission  Medication Sig Dispense Refill  . Prenatal Vit-Fe Fumarate-FA (PRENATAL MULTIVITAMIN) TABS tablet Take 1 tablet by mouth daily at 12 noon.      . cephALEXin (KEFLEX) 500 MG capsule Take 1 capsule (500 mg total) by mouth 4 (four) times daily.  20 capsule  0    Review of Systems  Constitutional: Negative for fever and chills.  Respiratory: Negative for sputum production.   Cardiovascular: Negative for chest pain.  Gastrointestinal: Positive for abdominal pain. Negative for heartburn, nausea, vomiting, diarrhea and constipation.  Genitourinary: Positive for dysuria. Negative for urgency, frequency, hematuria and flank pain.  All other systems reviewed and are negative.  Physical Exam   Blood pressure 103/71, pulse 106, temperature 98.5 F (36.9 C), temperature source Oral, resp. rate 20, height 5\' 4"   (1.626 m), weight 129 lb (58.514 kg), last menstrual period 12/24/2013.  Physical Exam  Nursing note and vitals reviewed. Constitutional: She is oriented to person, place, and time. She appears well-developed and well-nourished.  HENT:  Head: Normocephalic.  Eyes: Pupils are equal, round, and reactive to light.  Neck: Normal range of motion.  Cardiovascular: Normal rate, regular rhythm and normal heart sounds.   Respiratory: Effort normal and breath sounds normal.  GI: Soft. Bowel sounds are normal.  Genitourinary: Vagina normal.  SSE, scant discharge in vault, cervix closed, long and firm   Musculoskeletal: Normal range of motion.  Neurological: She is alert and oriented to person, place, and time. She has normal reflexes.  Skin: Skin is warm and dry.  Psychiatric: She has a normal mood and affect. Her behavior is normal.    MAU Course  Procedures     Assessment and Plan  IUP at 14wks Wet prep neg GC/CT sent Likely bladder spasms UA cx pending sent from office todaY   Percocet 2 tabs PO now Motrin 600mg  PO Pyridium 100mg  PO Check CBC  Leukocytosis noted, however VSS, and pt's pain markedly improved D/C HOME  F/u office as scheduled RX pyridium, motrin and percocet, call if sx's not improving    LILLARD,SHELLEY M 06/05/2014,

## 2014-06-05 NOTE — Discharge Instructions (Signed)
Pregnancy and Urinary Tract Infection °A urinary tract infection (UTI) is a bacterial infection of the urinary tract. Infection of the urinary tract can include the ureters, kidneys (pyelonephritis), bladder (cystitis), and urethra (urethritis). All pregnant women should be screened for bacteria in the urinary tract. Identifying and treating a UTI will decrease the risk of preterm labor and developing more serious infections in both the mother and baby. °CAUSES °Bacteria germs cause almost all UTIs.  °RISK FACTORS °Many factors can increase your chances of getting a UTI during pregnancy. These include: °· Having a short urethra. °· Poor toilet and hygiene habits. °· Sexual intercourse. °· Blockage of urine along the urinary tract. °· Problems with the pelvic muscles or nerves. °· Diabetes. °· Obesity. °· Bladder problems after having several children. °· Previous history of UTI. °SIGNS AND SYMPTOMS  °· Pain, burning, or a stinging feeling when urinating. °· Suddenly feeling the need to urinate right away (urgency). °· Loss of bladder control (urinary incontinence). °· Frequent urination, more than is common with pregnancy. °· Lower abdominal or back discomfort. °· Cloudy urine. °· Blood in the urine (hematuria). °· Fever.  °When the kidneys are infected, the symptoms may be: °· Back pain. °· Flank pain on the right side more so than the left. °· Fever. °· Chills. °· Nausea. °· Vomiting. °DIAGNOSIS  °A urinary tract infection is usually diagnosed through urine tests. Additional tests and procedures are sometimes done. These may include: °· Ultrasound exam of the kidneys, ureters, bladder, and urethra. °· Looking in the bladder with a lighted tube (cystoscopy). °TREATMENT °Typically, UTIs can be treated with antibiotic medicines.  °HOME CARE INSTRUCTIONS  °· Only take over-the-counter or prescription medicines as directed by your health care provider. If you were prescribed antibiotics, take them as directed. Finish  them even if you start to feel better. °· Drink enough fluids to keep your urine clear or pale yellow. °· Do not have sexual intercourse until the infection is gone and your health care provider says it is okay. °· Make sure you are tested for UTIs throughout your pregnancy. These infections often come back.  °Preventing a UTI in the Future °· Practice good toilet habits. Always wipe from front to back. Use the tissue only once. °· Do not hold your urine. Empty your bladder as soon as possible when the urge comes. °· Do not douche or use deodorant sprays. °· Wash with soap and warm water around the genital area and the anus. °· Empty your bladder before and after sexual intercourse. °· Wear underwear with a cotton crotch. °· Avoid caffeine and carbonated drinks. They can irritate the bladder. °· Drink cranberry juice or take cranberry pills. This may decrease the risk of getting a UTI. °· Do not drink alcohol. °· Keep all your appointments and tests as scheduled.  °SEEK MEDICAL CARE IF:  °· Your symptoms get worse. °· You are still having fevers 2 or more days after treatment begins. °· You have a rash. °· You feel that you are having problems with medicines prescribed. °· You have abnormal vaginal discharge. °SEEK IMMEDIATE MEDICAL CARE IF:  °· You have back or flank pain. °· You have chills. °· You have blood in your urine. °· You have nausea and vomiting. °· You have contractions of your uterus. °· You have a gush of fluid from the vagina. °MAKE SURE YOU: °· Understand these instructions.   °· Will watch your condition.   °· Will get help right away if you are not doing   well or get worse.   °Document Released: 03/27/2011 Document Revised: 09/20/2013 Document Reviewed: 06/29/2013 °ExitCare® Patient Information ©2015 [employer]. This information is not intended to replace advice given to you by your health care provider. Make sure you discuss any questions you have with your health care provider. ° °

## 2014-10-15 ENCOUNTER — Encounter (HOSPITAL_COMMUNITY): Payer: Self-pay | Admitting: *Deleted

## 2014-11-08 ENCOUNTER — Encounter (HOSPITAL_COMMUNITY): Payer: Self-pay | Admitting: *Deleted

## 2014-11-08 ENCOUNTER — Inpatient Hospital Stay (HOSPITAL_COMMUNITY): Payer: Medicaid Other | Admitting: Anesthesiology

## 2014-11-08 ENCOUNTER — Inpatient Hospital Stay (HOSPITAL_COMMUNITY)
Admission: AD | Admit: 2014-11-08 | Discharge: 2014-11-10 | DRG: 775 | Disposition: A | Discharge status: Home / Self Care | Payer: Medicaid Other | Source: Ambulatory Visit | Attending: Obstetrics and Gynecology | Admitting: Obstetrics and Gynecology

## 2014-11-08 DIAGNOSIS — Z3483 Encounter for supervision of other normal pregnancy, third trimester: Secondary | ICD-10-CM | POA: Diagnosis present

## 2014-11-08 DIAGNOSIS — Z3A37 37 weeks gestation of pregnancy: Secondary | ICD-10-CM | POA: Diagnosis present

## 2014-11-08 DIAGNOSIS — D573 Sickle-cell trait: Secondary | ICD-10-CM | POA: Diagnosis present

## 2014-11-08 DIAGNOSIS — F339 Major depressive disorder, recurrent, unspecified: Secondary | ICD-10-CM | POA: Diagnosis not present

## 2014-11-08 DIAGNOSIS — O9902 Anemia complicating childbirth: Secondary | ICD-10-CM | POA: Diagnosis present

## 2014-11-08 DIAGNOSIS — F32A Depression, unspecified: Secondary | ICD-10-CM | POA: Diagnosis not present

## 2014-11-08 LAB — TYPE AND SCREEN
ABO/RH(D): AB POS
ANTIBODY SCREEN: NEGATIVE

## 2014-11-08 LAB — CBC
HCT: 35.8 % — ABNORMAL LOW (ref 36.0–46.0)
Hemoglobin: 12.8 g/dL (ref 12.0–15.0)
MCH: 31.9 pg (ref 26.0–34.0)
MCHC: 35.8 g/dL (ref 30.0–36.0)
MCV: 89.3 fL (ref 78.0–100.0)
PLATELETS: 193 10*3/uL (ref 150–400)
RBC: 4.01 MIL/uL (ref 3.87–5.11)
RDW: 12.8 % (ref 11.5–15.5)
WBC: 11.1 10*3/uL — ABNORMAL HIGH (ref 4.0–10.5)

## 2014-11-08 LAB — HIV ANTIBODY (ROUTINE TESTING W REFLEX): HIV 1&2 Ab, 4th Generation: NONREACTIVE

## 2014-11-08 LAB — POCT FERN TEST: POCT Fern Test: POSITIVE

## 2014-11-08 LAB — RPR

## 2014-11-08 MED ORDER — FENTANYL 2.5 MCG/ML BUPIVACAINE 1/10 % EPIDURAL INFUSION (WH - ANES)
14.0000 mL/h | INTRAMUSCULAR | Status: DC | PRN
  Administered 2014-11-08: 14 mL/h via EPIDURAL

## 2014-11-08 MED ORDER — OXYCODONE-ACETAMINOPHEN 5-325 MG PO TABS: 2.0000 | ORAL_TABLET | ORAL | Status: DC | PRN

## 2014-11-08 MED ORDER — OXYCODONE-ACETAMINOPHEN 5-325 MG PO TABS: 1.0000 | ORAL_TABLET | ORAL | Status: DC | PRN

## 2014-11-08 MED ORDER — ONDANSETRON HCL 4 MG/2ML IJ SOLN: 4.0000 mg | INTRAMUSCULAR | Status: DC | PRN

## 2014-11-08 MED ORDER — TETANUS-DIPHTH-ACELL PERTUSSIS 5-2.5-18.5 LF-MCG/0.5 IM SUSP
0.5000 mL | Freq: Once | INTRAMUSCULAR | Status: DC
Start: 2014-11-09 — End: 2014-11-10

## 2014-11-08 MED ORDER — LACTATED RINGERS IV SOLN
500.0000 mL | Freq: Once | INTRAVENOUS | Status: AC
  Administered 2014-11-08: 500 mL via INTRAVENOUS

## 2014-11-08 MED ORDER — ONDANSETRON HCL 4 MG/2ML IJ SOLN: 4.0000 mg | Freq: Four times a day (QID) | INTRAMUSCULAR | Status: DC | PRN

## 2014-11-08 MED ORDER — ACETAMINOPHEN 325 MG PO TABS: 650.0000 mg | ORAL_TABLET | ORAL | Status: DC | PRN

## 2014-11-08 MED ORDER — SODIUM CHLORIDE 0.9 % IJ SOLN: 3.0000 mL | Freq: Two times a day (BID) | INTRAMUSCULAR | Status: DC

## 2014-11-08 MED ORDER — LACTATED RINGERS IV SOLN
INTRAVENOUS | Status: DC
  Administered 2014-11-08 (×2): via INTRAVENOUS

## 2014-11-08 MED ORDER — SIMETHICONE 80 MG PO CHEW: 80.0000 mg | CHEWABLE_TABLET | ORAL | Status: DC | PRN

## 2014-11-08 MED ORDER — EPHEDRINE 5 MG/ML INJ
10.0000 mg | INTRAVENOUS | Status: DC | PRN
  Filled 2014-11-08: qty 2

## 2014-11-08 MED ORDER — LANOLIN HYDROUS EX OINT
TOPICAL_OINTMENT | CUTANEOUS | Status: DC | PRN
Start: 2014-11-08 — End: 2014-11-10

## 2014-11-08 MED ORDER — LACTATED RINGERS IV SOLN: 500.0000 mL | INTRAVENOUS | Status: DC | PRN

## 2014-11-08 MED ORDER — OXYTOCIN 40 UNITS IN LACTATED RINGERS INFUSION - SIMPLE MED
62.5000 mL/h | INTRAVENOUS | Status: DC
  Filled 2014-11-08: qty 1000

## 2014-11-08 MED ORDER — IBUPROFEN 600 MG PO TABS
600.0000 mg | ORAL_TABLET | Freq: Four times a day (QID) | ORAL | Status: DC
  Administered 2014-11-08 – 2014-11-10 (×6): 600 mg via ORAL
  Filled 2014-11-08 (×6): qty 1

## 2014-11-08 MED ORDER — FENTANYL 2.5 MCG/ML BUPIVACAINE 1/10 % EPIDURAL INFUSION (WH - ANES)
INTRAMUSCULAR | Status: DC | PRN
  Administered 2014-11-08: 13 mL/h via EPIDURAL

## 2014-11-08 MED ORDER — CITRIC ACID-SODIUM CITRATE 334-500 MG/5ML PO SOLN: 30.0000 mL | ORAL | Status: DC | PRN

## 2014-11-08 MED ORDER — OXYTOCIN 40 UNITS IN LACTATED RINGERS INFUSION - SIMPLE MED
1.0000 m[IU]/min | INTRAVENOUS | Status: DC
  Administered 2014-11-08: 2 m[IU]/min via INTRAVENOUS
  Filled 2014-11-08: qty 1000

## 2014-11-08 MED ORDER — SODIUM CHLORIDE 0.9 % IV SOLN: 250.0000 mL | INTRAVENOUS | Status: DC | PRN

## 2014-11-08 MED ORDER — DIPHENHYDRAMINE HCL 25 MG PO CAPS: 25.0000 mg | ORAL_CAPSULE | Freq: Four times a day (QID) | ORAL | Status: DC | PRN

## 2014-11-08 MED ORDER — SODIUM CHLORIDE 0.9 % IJ SOLN: 3.0000 mL | INTRAMUSCULAR | Status: DC | PRN

## 2014-11-08 MED ORDER — WITCH HAZEL-GLYCERIN EX PADS: 1.0000 "application " | MEDICATED_PAD | CUTANEOUS | Status: DC | PRN

## 2014-11-08 MED ORDER — PENICILLIN G POTASSIUM 5000000 UNITS IJ SOLR
2.5000 10*6.[IU] | INTRAMUSCULAR | Status: DC
  Administered 2014-11-08 (×2): 2.5 10*6.[IU] via INTRAVENOUS
  Filled 2014-11-08 (×4): qty 2.5

## 2014-11-08 MED ORDER — LIDOCAINE HCL (PF) 1 % IJ SOLN
INTRAMUSCULAR | Status: DC | PRN
Start: 2014-11-08 — End: 2014-11-08
  Administered 2014-11-08 (×2): 4 mL

## 2014-11-08 MED ORDER — ZOLPIDEM TARTRATE 5 MG PO TABS: 5.0000 mg | ORAL_TABLET | Freq: Every evening | ORAL | Status: DC | PRN

## 2014-11-08 MED ORDER — PHENYLEPHRINE 40 MCG/ML (10ML) SYRINGE FOR IV PUSH (FOR BLOOD PRESSURE SUPPORT)
80.0000 ug | PREFILLED_SYRINGE | INTRAVENOUS | Status: DC | PRN
  Filled 2014-11-08: qty 2

## 2014-11-08 MED ORDER — CEFAZOLIN SODIUM-DEXTROSE 2-3 GM-% IV SOLR
2.0000 g | Freq: Three times a day (TID) | INTRAVENOUS | Status: DC
Start: 2014-11-08 — End: 2014-11-08

## 2014-11-08 MED ORDER — ONDANSETRON HCL 4 MG PO TABS: 4.0000 mg | ORAL_TABLET | ORAL | Status: DC | PRN

## 2014-11-08 MED ORDER — PENICILLIN G POTASSIUM 5000000 UNITS IJ SOLR
5.0000 10*6.[IU] | Freq: Once | INTRAMUSCULAR | Status: AC
  Administered 2014-11-08: 5 10*6.[IU] via INTRAVENOUS
  Filled 2014-11-08: qty 5

## 2014-11-08 MED ORDER — FENTANYL 2.5 MCG/ML BUPIVACAINE 1/10 % EPIDURAL INFUSION (WH - ANES)
INTRAMUSCULAR | Status: AC
  Filled 2014-11-08: qty 125

## 2014-11-08 MED ORDER — BENZOCAINE-MENTHOL 20-0.5 % EX AERO
1.0000 "application " | INHALATION_SPRAY | CUTANEOUS | Status: DC | PRN
  Administered 2014-11-09: 1 via TOPICAL
  Filled 2014-11-08: qty 56

## 2014-11-08 MED ORDER — TERBUTALINE SULFATE 1 MG/ML IJ SOLN: 0.2500 mg | Freq: Once | INTRAMUSCULAR | Status: DC | PRN

## 2014-11-08 MED ORDER — SENNOSIDES-DOCUSATE SODIUM 8.6-50 MG PO TABS
2.0000 | ORAL_TABLET | ORAL | Status: DC
  Administered 2014-11-08 – 2014-11-09 (×2): 2 via ORAL
  Filled 2014-11-08 (×2): qty 2

## 2014-11-08 MED ORDER — DIBUCAINE 1 % RE OINT: 1.0000 "application " | TOPICAL_OINTMENT | RECTAL | Status: DC | PRN

## 2014-11-08 MED ORDER — LIDOCAINE HCL (PF) 1 % IJ SOLN
30.0000 mL | INTRAMUSCULAR | Status: DC | PRN
  Filled 2014-11-08: qty 30

## 2014-11-08 MED ORDER — OXYTOCIN BOLUS FROM INFUSION: 500.0000 mL | INTRAVENOUS | Status: DC

## 2014-11-08 MED ORDER — PRENATAL MULTIVITAMIN CH
1.0000 | ORAL_TABLET | Freq: Every day | ORAL | Status: DC
  Administered 2014-11-09: 1 via ORAL
  Filled 2014-11-08: qty 1

## 2014-11-08 MED ORDER — PHENYLEPHRINE 40 MCG/ML (10ML) SYRINGE FOR IV PUSH (FOR BLOOD PRESSURE SUPPORT)
PREFILLED_SYRINGE | INTRAVENOUS | Status: AC
  Filled 2014-11-08: qty 10

## 2014-11-08 MED ORDER — DIPHENHYDRAMINE HCL 50 MG/ML IJ SOLN: 12.5000 mg | INTRAMUSCULAR | Status: DC | PRN

## 2014-11-08 NOTE — H&P (Signed)
Linda Huber is a 23 y.o. female, G2 P1001 at 37.0 weeks present to MAU c/o LOF at 0700 this morning.  Denies VB w/+FM.  Report GBS was don't last week.  Fern positive.  GBS is pending, labs will not have results until late tonight.  Will treat prophylacticly   There are no active problems to display for this patient.   Pregnancy Course: Patient entered care at 7.5 weeks.   EDC of 11/29/14 was established by US on 04/17/14.      US evaluations:   9.2 weeks - Dating: S<D - AUA 7.5 weeks 3%, FHR 167, anterverted  Uterus, date changed per US  11.0 weeks - viability:  FHR 170, S c/w US  18.2 weeks - Anatomy: EFW 8oz - 34%, cervical length 3.50, FHR 153, anatomy wnl, breech, posterior fundal placenta, cervix closed, normal fluid, female     32.5 weeks - FU: AUA 32.15, EFW 4lb 9oz - 14%, AFI 16.8, FHR 145, vertex, posterior placenta, normal fluid,   36.5 weeks - FU: EFW 5lb 7oz - 9.7%, AFI 13.78, FHR 159,  BPP 8/8, no reversed or absent flow S/D raito 2.18  Significant prenatal events:   Recurrent ecoli - urology consult, Sickle cell trait,  Growth lag th% at 36.5 weeks Last evaluation:   36.5 weeks   VE: not done  Reason for admission:  PROM  Pt States:   Contractions Frequency: none         Contraction severity: n/a         Fetal activity: +FM  OB History    Gravida Para Term Preterm AB TAB SAB Ectopic Multiple Living   2 1 1       1      Past Medical History  Diagnosis Date  . Infection     UTI  . Depression     not on meds, doing good  . Urinary tract infection    Past Surgical History  Procedure Laterality Date  . Biopsy breast     Family History: family history includes Asthma in her mother; Hypertension in her mother; Leukemia in her paternal grandmother; Stroke in her maternal grandfather. There is no history of Cancer or Heart disease. Social History:  reports that she has never smoked. She has never used smokeless tobacco. She reports that she drinks alcohol. She reports  that she does not use illicit drugs.   Prenatal Transfer Tool  Maternal Diabetes: No Genetic Screening: Declined Maternal Ultrasounds/Referrals: Normal Fetal Ultrasounds or other Referrals:  None Maternal Substance Abuse:  No Significant Maternal Medications:  None Significant Maternal Lab Results: Lab values include: Other: GBS done on 11/06/14 - not resulted.  Will treat as positive   ROS:  See HPI above, all other systems are negative  No Known Allergies    Blood pressure 117/88, pulse 88, temperature 97.5 F (36.4 C), temperature source Oral, resp. rate 20, last menstrual period 12/24/2013.  Maternal Exam:  Uterine Assessment: Contraction frequency is rare.  Abdomen: Gravid, non tender. Fundal height is aga.  Normal external genitalia, vulva, cervix, uterus and adnexa.  No lesions noted on exam.  Pelvis adequate for delivery.  Fetal presentation: Vertex by Leopold's   Fetal Exam:  Monitor Surveillance : Intermitting Monitoring  Mode: Ultrasound.  NICHD: Category 1, FHR 130 + accels, no decels, moderate variability CTXs: occasional  EFW   5 lbs  Physical Exam: Nursing note and vitals reviewed General: alert and cooperative She appears well nourished Psychiatric: Normal mood and  affect. Her behavior is normal Head: Normocephalic Eyes: Pupils are equal, round, and reactive to light Neck: Normal range of motion Cardiovascular: RRR without murmur  Respiratory: CTAB. Effort normal  Abd: soft, non-tender, +BS, no rebound, no guarding  Genitourinary: Vagina normal  Neurological: A&Ox3 Skin: Warm and dry  Musculoskeletal: Normal range of motion  Homan's sign negative bilaterally No evidence of DVTs.  Edema: No edema DTR: 2+ Clonus: None   Prenatal labs: ABO, Rh: --/--/AB POS (02/28 1541) Antibody:  negative Rubella: immune    RPR:   NR HBsAg:   negative HIV:   NR GBS:  pending, will consider positive Sickle cell/Hgb electrophoresis:  WNL Pap:  05/02/14  wnl GC:   negative Chlamydia: negative Genetic screenings:   Glucola:  wnl  Assessment:  IUP at 37.0 weeks NICHD: Category Membranes: SROM x4hrs Bishop Score: 4 GBS un resulted  Plan:  Admit to L&D for expectant management of labor. Possible augmentation options reviewed including foley bulb, AROM and/or pitocin.   GBS prophylaxis with PCN G per Kytzia Gienger dosing   IV pain medication per orders PRN Epidural per patient request Foley cath after patient is comfortable with epidural  Anticipate SVD  Labor mgmt as ordered  Okay to ambulate around unit with wireless monitors  Okay to get up and shower without monitoring  Pt may have breakfast  May auscultate FHR intermittently,  if expectant management     q 30 min in active labor     q 15 min in transition     q 5 min with pushing.     May ambulate without monitoring.     If no active labor, may do NST q 2 hours.   Attending MD available at all times.    Thaily Hackworth, CNM, MSN 11/08/2014, 9:04 AM       All information will be confirmed upon admisson

## 2014-11-08 NOTE — Plan of Care (Signed)
Problem: Discharge Progression Outcomes Goal: Catheter in place prior to transfer Outcome: Not Applicable Date Met:  96/92/49

## 2014-11-08 NOTE — MAU Note (Signed)
Pt had gush of fluid @ 0700, blood tinged fluid down her legs.  Had mild cramping last night, occasional painless uc's.

## 2014-11-08 NOTE — Progress Notes (Signed)
Pt sleeping, FOB at the bedside

## 2014-11-08 NOTE — Anesthesia Procedure Notes (Signed)
Epidural Patient location during procedure: OB Start time: 11/08/2014 6:31 PM  Staffing Anesthesiologist: Hamid Brookens A. Performed by: anesthesiologist   Preanesthetic Checklist Completed: patient identified, site marked, surgical consent, pre-op evaluation, timeout performed, IV checked, risks and benefits discussed and monitors and equipment checked  Epidural Patient position: sitting Prep: site prepped and draped and DuraPrep Patient monitoring: continuous pulse ox and blood pressure Approach: midline Location: L3-L4 Injection technique: LOR air  Needle:  Needle type: Tuohy  Needle gauge: 17 G Needle length: 9 cm and 9 Needle insertion depth: 4 cm Catheter type: closed end flexible Catheter size: 19 Gauge Catheter at skin depth: 9 cm Test dose: negative and Other  Assessment Events: blood not aspirated, injection not painful, no injection resistance, negative IV test and no paresthesia  Additional Notes Patient identified. Risks and benefits discussed including failed block, incomplete  Pain control, post dural puncture headache, nerve damage, paralysis, blood pressure Changes, nausea, vomiting, reactions to medications-both toxic and allergic and post Partum back pain. All questions were answered. Patient expressed understanding and wished to proceed. Sterile technique was used throughout procedure. Epidural site was Dressed with sterile barrier dressing. No paresthesias, signs of intravascular injection Or signs of intrathecal spread were encountered.  Patient was more comfortable after the epidural was dosed. Please see RN's note for documentation of vital signs and FHR which are stable.

## 2014-11-08 NOTE — Anesthesia Preprocedure Evaluation (Signed)
Anesthesia Evaluation  Patient identified by MRN, date of birth, ID band Patient awake    Reviewed: Allergy & Precautions, H&P , Patient's Chart, lab work & pertinent test results  Airway Mallampati: II  TM Distance: >3 FB Neck ROM: Full    Dental no notable dental hx. (+) Teeth Intact   Pulmonary neg pulmonary ROS,  breath sounds clear to auscultation  Pulmonary exam normal       Cardiovascular negative cardio ROS  Rhythm:Regular Rate:Normal     Neuro/Psych PSYCHIATRIC DISORDERS Depression negative neurological ROS     GI/Hepatic negative GI ROS, Neg liver ROS,   Endo/Other  negative endocrine ROS  Renal/GU negative Renal ROS  negative genitourinary   Musculoskeletal negative musculoskeletal ROS (+)   Abdominal   Peds  Hematology negative hematology ROS (+)   Anesthesia Other Findings   Reproductive/Obstetrics (+) Pregnancy                             Anesthesia Physical Anesthesia Plan  ASA: II  Anesthesia Plan: Epidural   Post-op Pain Management:    Induction:   Airway Management Planned: Natural Airway  Additional Equipment:   Intra-op Plan:   Post-operative Plan:   Informed Consent: I have reviewed the patients History and Physical, chart, labs and discussed the procedure including the risks, benefits and alternatives for the proposed anesthesia with the patient or authorized representative who has indicated his/her understanding and acceptance.     Plan Discussed with: Anesthesiologist  Anesthesia Plan Comments:         Anesthesia Quick Evaluation

## 2014-11-08 NOTE — Plan of Care (Signed)
Problem: Phase II Progression Outcomes Goal: Fetal monitoring per orders Outcome: Completed/Met Date Met:  11/08/14 Goal: Key steps for effective pushing & educate pt/family Outcome: Completed/Met Date Met:  11/08/14

## 2014-11-08 NOTE — Progress Notes (Addendum)
Labor Progress  Subjective: Ctx starting to get uncomfortable, breath thru each one.  Family at the bedside  Objective: BP 120/86 mmHg  Pulse 102  Temp(Src) 98.1 F (36.7 C) (Oral)  Resp 18  Ht 5\' 4"  (1.626 m)  Wt 147 lb (66.679 kg)  BMI 25.22 kg/m2  LMP 12/24/2013     FHT: 140, moderate variability, + accels, variable decels 1 down to 90 for 60 sec with quick recovery to baseline CTX:  regular, every 5 minutes Uterus gravid, soft non tender SVE:  Dilation: 3 Effacement (%): 50 Exam by:: cnm Aminata Buffalo Pitocin at 434mUn/min  Assessment:  IUP at 37.0 weeks NICHD: Category 2 Membranes:  SROM x 10no s/s of infection Labor progress: early Pitocin Augmentation GBS: unknown  Plan: Continue labor plan Continuous monitoring Rest/mbulate Frequent position changes to facilitate fetal rotation and descent. Continue pitocin per protocol   Gerrianne Aydelott, CNM, MSN 11/08/2014. 5:40 PM

## 2014-11-08 NOTE — Progress Notes (Signed)
Labor Progress  Subjective: Ambulating in the room, FOB at the bedside  Objective: BP 120/86 mmHg  Pulse 102  Temp(Src) 98.1 F (36.7 C) (Oral)  Resp 18  Ht 5\' 4"  (1.626 m)  Wt 147 lb (66.679 kg)  BMI 25.22 kg/m2  LMP 12/24/2013     FHT: 140, moderate variability, + accel, no decel CTX:  regular, every 4-5 minutes Uterus gravid, soft non tender SVE:  Dilation: 1.5 Effacement (%): Thick Exam by:: Keinan Brouillet, cnm Pitocin at 764mUn/min  Assessment:  IUP at 4 weeks NICHD: Category 1 Membranes:  SROM x 8hrs, no s/s of infection Labor progress: early Pitocin Augmentation GBS: unknown   Plan: Continue labor plan Continuous/intermittent monitoring Ambulate Frequent position changes to facilitate fetal rotation and descent. Will reassess with cervical exam at 1700 or earlier if necessary Continue pitocin per protocol    Linda Huber, CNM, MSN 11/08/2014. 4:25 PM

## 2014-11-09 DIAGNOSIS — F339 Major depressive disorder, recurrent, unspecified: Secondary | ICD-10-CM | POA: Diagnosis not present

## 2014-11-09 DIAGNOSIS — D573 Sickle-cell trait: Secondary | ICD-10-CM | POA: Diagnosis present

## 2014-11-09 DIAGNOSIS — F32A Depression, unspecified: Secondary | ICD-10-CM | POA: Diagnosis not present

## 2014-11-09 LAB — CBC
HCT: 32 % — ABNORMAL LOW (ref 36.0–46.0)
Hemoglobin: 11.1 g/dL — ABNORMAL LOW (ref 12.0–15.0)
MCH: 31 pg (ref 26.0–34.0)
MCHC: 34.7 g/dL (ref 30.0–36.0)
MCV: 89.4 fL (ref 78.0–100.0)
PLATELETS: 181 10*3/uL (ref 150–400)
RBC: 3.58 MIL/uL — AB (ref 3.87–5.11)
RDW: 12.8 % (ref 11.5–15.5)
WBC: 13 10*3/uL — ABNORMAL HIGH (ref 4.0–10.5)

## 2014-11-09 NOTE — Plan of Care (Signed)
Problem: Phase II Progression Outcomes Goal: Pain controlled on oral analgesia Outcome: Completed/Met Date Met:  11/09/14 Goal: Progress activity as tolerated unless otherwise ordered Outcome: Completed/Met Date Met:  11/09/14 Goal: Afebrile, VS remain stable Outcome: Completed/Met Date Met:  11/09/14 Goal: Incision intact & without signs/symptoms of infection Outcome: Not Applicable Date Met:  23/30/07 Goal: Tolerating diet Outcome: Completed/Met Date Met:  11/09/14 Goal: Other Phase II Outcomes/Goals Outcome: Not Applicable Date Met:  62/26/33

## 2014-11-09 NOTE — Discharge Summary (Signed)
  Vaginal Delivery Discharge Summary  Linda Huber  DOB:    [birth year] MRN:    [id number] CSN:    [id number]  Date of admission:                  11/08/14  Date of discharge:                   11/10/14  Procedures this admission:   SVB  Date of Delivery: 11/08/14  Newborn Data:  Live born female  Birth Weight: 5 lb 4 oz (2380 g) APGAR: 9, 9  Home with mother. Name: Linda Huber   History of Present Illness:  Ms. Linda Huber is a 23 y.o. female, G2P2002, who presents at 4648w0d weeks gestation. [facility]. She was admitted rupture of membranes. Her pregnancy has been complicated by:  Patient Active Problem List   Diagnosis Date Noted  . SGA (small for gestational age) 11/09/2014  . Sickle cell trait 11/09/2014  . Chronic depression 11/09/2014  . Vaginal delivery 11/08/2014     Hospital Course:  Admitted 11/08/14. Pending GBS at the time of admission, so prophylaxis was initiated--GBS result was noted to be negative after delivery.. Progressed with pitocin augmentation. Utilized epidural for pain management.  [facility] without complication. Patient and baby tolerated the procedure without difficulty, with no lacerations noted. Infant status was stable and remained in room with mother.  Mother and infant then had an uncomplicated postpartum course, with breast feeding going well. Mom's physical exam was WNL, and she was discharged home in stable condition. Contraception plan was Mirena at 6 weeks.  She received adequate benefit from po pain medications, with only Ibuprophen requested.  Rx also given for breast pump at d/c.   Feeding:  breast  Contraception:  Mirena  Discharge hemoglobin:  HEMOGLOBIN  Date Value Ref Range Status  11/09/2014 11.1* 12.0 - 15.0 g/dL Final   HCT  Date Value Ref Range Status  11/09/2014 32.0* 36.0 -  46.0 % Final    Discharge Physical Exam:   General: alert Lochia: appropriate Uterine Fundus: firm Incision: Perineum intact DVT Evaluation: No evidence of DVT seen on physical exam. Negative Homan's sign.  Intrapartum Procedures: spontaneous vaginal delivery Postpartum Procedures: none Complications-Operative and Postpartum: none  Discharge Diagnoses: Term Pregnancy-delivered  Discharge Information:  Activity:           pelvic rest Diet:                routine Medications: Ibuprofen Condition:      stable Instructions:     Discharge to: home  Follow-up Information    Follow up with [facility] In 5 weeks.   Specialty:  Obstetrics and Gynecology   Why:  Call to schedule 5 week postpartum appointment.  We will plan to place Mirena at follow-up visit at 6 weeks.   Contact information:   [address] [place] [phone]       Nigel BridgemanLATHAM, Arizbeth Cawthorn CNM 11/10/2014 8:50 AM

## 2014-11-09 NOTE — Plan of Care (Signed)
Problem: Consults Goal: Postpartum Patient Education (See Patient Education module for education specifics.) Outcome: Progressing  Problem: Phase I Progression Outcomes Goal: Pain controlled with appropriate interventions Outcome: Completed/Met Date Met:  11/09/14 Goal: Voiding adequately Outcome: Completed/Met Date Met:  11/09/14 Goal: OOB as tolerated unless otherwise ordered Outcome: Completed/Met Date Met:  11/09/14 Goal: VS, stable, temp < 100.4 degrees F Outcome: Completed/Met Date Met:  11/09/14 Goal: Initial discharge plan identified Outcome: Completed/Met Date Met:  11/09/14

## 2014-11-09 NOTE — Anesthesia Postprocedure Evaluation (Signed)
Anesthesia Post Note  Patient: Linda Huber  Procedure(s) Performed: * No procedures listed *  Anesthesia type: Epidural  Patient location: Mother/Baby  Post pain: Pain level controlled  Post assessment: Post-op Vital signs reviewed  Last Vitals:  Filed Vitals:   11/09/14 0659  BP: 102/68  Pulse: 64  Temp: 36.8 C  Resp: 17    Post vital signs: Reviewed  Level of consciousness: awake  Complications: No apparent anesthesia complications

## 2014-11-09 NOTE — Plan of Care (Signed)
Problem: Phase I Progression Outcomes Goal: Other Phase I Outcomes/Goals Outcome: Not Applicable Date Met:  11/09/14     

## 2014-11-09 NOTE — Progress Notes (Signed)
Subjective: Postpartum Day 1: Vaginal delivery, no lacerations Patient up ad lib, reports no syncope or dizziness. Feeding:  Breast Contraceptive plan:  Undecided  Objective: Vital signs in last 24 hours: Temp:  [97.7 F (36.5 C)-98.6 F (37 C)] 98.3 F (36.8 C) (11/27 0659) Pulse Rate:  [59-145] 64 (11/27 0659) Resp:  [17-18] 17 (11/27 0659) BP: (100-129)/(54-95) 102/68 mmHg (11/27 0659) SpO2:  [98 %-100 %] 99 % (11/27 0659) Weight:  [147 lb (66.679 kg)] 147 lb (66.679 kg) (11/26 1232)  Physical Exam:  General: alert Lochia: appropriate Uterine Fundus: firm Perineum: Perineum intact DVT Evaluation: No evidence of DVT seen on physical exam. Negative Homan's sign.    Recent Labs  11/08/14 1030 11/09/14 0609  HGB 12.8 11.1*  HCT 35.8* 32.0*    Assessment/Plan: Status post vaginal delivery day 1. Stable Continue current care. Plan for discharge tomorrow    Nyra CapesLATHAM, VICKICNM 11/09/2014, 8:34 AM

## 2014-11-09 NOTE — Lactation Note (Signed)
This note was copied from the chart of Linda Huber. Lactation Consultation Note  Mother breastfed and pumped older daughter for 16 months. Mother states she wants to formula feed and then breastfeed when she goes home. Reviewed how important it is keep baby STS and breastfeed often to establish her milk supply. Discussed supply and demand, LPI feeding behavior, and volume guidelines. Reviewed hand expression and mother was able to express  1ml of colostrum into spoon easily. Undressed baby and spoon fed baby colostrum and attempted breastfeeding.  Baby too sleepy. Encouraged keeping her STS. Suggested that mother breasteed first then supplement with formula. Mom encouraged to feed baby every 3 hours and with feeding cues.  Mom made aware of O/P services, breastfeeding support groups, community resources, and our phone # for post-discharge questions.     Patient Name: Linda Huber [id number]'s Date: 11/09/2014 Reason for consult: Initial assessment   Maternal Data    Feeding Feeding Type: Breast Fed Length of feed: 0 min  LATCH Score/Interventions                      Lactation Tools Discussed/Used     Consult Status Consult Status: Follow-up Date: 11/10/14 Follow-up type: In-patient    Dahlia ByesBerkelhammer, Emelynn Rance [facility] 11/09/2014, 12:17 PM

## 2014-11-10 MED ORDER — IBUPROFEN 600 MG PO TABS: 600.0000 mg | ORAL_TABLET | Freq: Four times a day (QID) | ORAL | Status: DC | PRN

## 2014-11-10 MED ORDER — BREAST PUMP MISC: 1.0000 [IU] | Status: DC | PRN

## 2014-11-10 NOTE — Lactation Note (Signed)
This note was copied from the chart of Linda Lenyx Boody. Lactation Consultation Note  Patient Name: Linda Huber [id number] Date: 11/10/2014 Reason for consult: Follow-up assessment;Infant < 6lbs;Late preterm infant born at 32 weeks.  Mom is continuing to both breastfeed and supplement with formula per her plan.  She is experienced with both breastfeeding and pumping and verbalizes awareness of need to stimulate breasts regularly for milk production.Mom and baby are ready for discharge.  Mom has been both breast and formula feeding and using DEBP for additional stimulation.  She states that she will get a personal pump after discharge and is taking all pump parts from [place] for use, as needed.  LC reviewed importance of cue feeding but a minimum of q3h feedings due to baby's weight.  LC also recommends pumping 15 minutes per breast for any feedings with supplement and to offer ebm as available.  Mom wants to call as needed to schedule OP appointment so [facility] encouraged her to attend support groups for additional breastfeeding information, weight check, and peer support.  LC reviewed engorgement care per Baby and Me, page 9.     Maternal Data    Feeding    LATCH Score/Interventions               No LATCH score although mom is experienced breastfeeding mom and has breastfed for 10-25 minutes several times since delivery; she has primarily been feeding formula and pumping for stimulation       Lactation Tools Discussed/Used   Engorgement care, supply and demand, pumping recommendations Post-discharge breastfeeding support  Consult Status Consult Status: Complete Date: 11/11/14 Follow-up type: Call as needed    Junious Dresser [facility] 11/10/2014, 10:22 AM

## 2014-11-10 NOTE — Plan of Care (Signed)
Problem: Discharge Progression Outcomes Goal: Tolerating diet Outcome: Completed/Met Date Met:  11/10/14 Goal: Pain controlled with appropriate interventions Outcome: Completed/Met Date Met:  11/10/14 Goal: Remove staples per MD order Outcome: Not Applicable Date Met:  55/00/16 Goal: Discharge plan in place and appropriate Outcome: Completed/Met Date Met:  11/10/14 Goal: Other Discharge Outcomes/Goals Outcome: Not Applicable Date Met:  42/90/37

## 2014-11-10 NOTE — Discharge Instructions (Signed)

## 2014-11-12 NOTE — Progress Notes (Signed)
Post discharge chart review completed.  

## 2015-06-18 ENCOUNTER — Encounter (HOSPITAL_COMMUNITY): Payer: Self-pay | Admitting: Emergency Medicine

## 2015-06-18 ENCOUNTER — Emergency Department (HOSPITAL_COMMUNITY)
Admission: EM | Admit: 2015-06-18 | Discharge: 2015-06-18 | Disposition: A | Discharge status: Home / Self Care | Payer: Medicaid Other | Attending: Emergency Medicine | Admitting: Emergency Medicine

## 2015-06-18 DIAGNOSIS — Y289XXA Contact with unspecified sharp object, undetermined intent, initial encounter: Secondary | ICD-10-CM | POA: Diagnosis not present

## 2015-06-18 DIAGNOSIS — Y998 Other external cause status: Secondary | ICD-10-CM | POA: Diagnosis not present

## 2015-06-18 DIAGNOSIS — Y9389 Activity, other specified: Secondary | ICD-10-CM | POA: Diagnosis not present

## 2015-06-18 DIAGNOSIS — Z79899 Other long term (current) drug therapy: Secondary | ICD-10-CM | POA: Diagnosis not present

## 2015-06-18 DIAGNOSIS — Z87440 Personal history of urinary (tract) infections: Secondary | ICD-10-CM | POA: Diagnosis not present

## 2015-06-18 DIAGNOSIS — Y9289 Other specified places as the place of occurrence of the external cause: Secondary | ICD-10-CM | POA: Insufficient documentation

## 2015-06-18 DIAGNOSIS — S61211A Laceration without foreign body of left index finger without damage to nail, initial encounter: Secondary | ICD-10-CM | POA: Diagnosis not present

## 2015-06-18 DIAGNOSIS — Z8659 Personal history of other mental and behavioral disorders: Secondary | ICD-10-CM | POA: Insufficient documentation

## 2015-06-18 DIAGNOSIS — IMO0002 Reserved for concepts with insufficient information to code with codable children: Secondary | ICD-10-CM

## 2015-06-18 DIAGNOSIS — Z23 Encounter for immunization: Secondary | ICD-10-CM | POA: Diagnosis not present

## 2015-06-18 MED ORDER — LIDOCAINE HCL 2 % IJ SOLN
15.0000 mL | Freq: Once | INTRAMUSCULAR | Status: AC
  Administered 2015-06-18: 300 mg
  Filled 2015-06-18: qty 20

## 2015-06-18 MED ORDER — TETANUS-DIPHTH-ACELL PERTUSSIS 5-2.5-18.5 LF-MCG/0.5 IM SUSP
0.5000 mL | Freq: Once | INTRAMUSCULAR | Status: AC
  Administered 2015-06-18: 0.5 mL via INTRAMUSCULAR
  Filled 2015-06-18: qty 0.5

## 2015-06-18 MED ORDER — ACETAMINOPHEN 325 MG PO TABS
650.0000 mg | ORAL_TABLET | Freq: Once | ORAL | Status: AC
  Administered 2015-06-18: 650 mg via ORAL
  Filled 2015-06-18: qty 2

## 2015-06-18 NOTE — ED Provider Notes (Signed)
CSN: [id number]     Arrival date & time 06/18/15  1803 History  This chart was scribed for non-physician provider Marlon Pel, PA-C, working with Mancel Bale, MD by Phillis Haggis, ED Scribe. This patient was seen in room TR05C/TR05C and patient care was started at 6:33 PM.    Chief Complaint  Patient presents with  . Laceration   The history is provided by the patient. No language interpreter was used.    HPI Comments: Linda Huber is a 24 y.o. female who presents to the Emergency Department complaining of a left index finger laceration onset one hour ago. She reports that she was making crafts with a new razor when the razor slipped and cut her across the finger; bleeding is currently controlled. She denies any other symptoms or injuries. Denies being on blood thinners. Not actively bleeding.  Past Medical History  Diagnosis Date  . Infection     UTI  . Depression     not on meds, doing good  . Urinary tract infection    Past Surgical History  Procedure Laterality Date  . Biopsy breast     Family History  Problem Relation Age of Onset  . Leukemia Paternal Grandmother   . Stroke Maternal Grandfather   . Asthma Mother   . Hypertension Mother   . Cancer Neg Hx   . Heart disease Neg Hx    History  Substance Use Topics  . Smoking status: Never Smoker   . Smokeless tobacco: Never Used  . Alcohol Use: Yes     Comment: not with + preg   OB History    Gravida Para Term Preterm AB TAB SAB Ectopic Multiple Living   0 2     Review of Systems A complete 10 system review of systems was obtained and all systems are negative except as noted in the HPI and PMH.   Allergies  Review of patient's allergies indicates no known allergies.  Home Medications   Prior to Admission medications   Medication Sig Start Date End Date Taking? Authorizing Provider  ibuprofen (ADVIL,MOTRIN) 600 MG tablet Take 1 tablet (600 mg total) by mouth every 6 (six) hours as needed. 11/10/14    Nigel Bridgeman, CNM  Misc. Devices (BREAST PUMP) MISC 1 Units by Does not apply route as needed. 11/10/14   Nigel Bridgeman, CNM  Prenatal Vit-Fe Fumarate-FA (PRENATAL MULTIVITAMIN) TABS tablet Take 1 tablet by mouth daily at 12 noon.    Historical Provider, MD   BP 112/77 mmHg  Pulse 77  Temp(Src) 97.8 F (36.6 C) (Oral)  Resp 16  Ht  (1.651 m)  Wt 124 lb 9.6 oz (56.518 kg)  BMI 20.73 kg/m2  SpO2 97%  LMP 06/05/2015  Physical Exam  Constitutional: She is oriented to person, place, and time. She appears well-developed and well-nourished.  HENT:  Head: Normocephalic and atraumatic.  Eyes: EOM are normal.  Neck: Normal range of motion. Neck supple.  Cardiovascular: Normal rate.   Cap refill <2 secs  Pulmonary/Chest: Effort normal.  Musculoskeletal: Normal range of motion.  Full ROM of all fingers  Neurological: She is alert and oriented to person, place, and time.  Decreased sensation across left index finger; normal strength  Skin: Skin is warm and dry.  3 cm laceration to dorsal aspect and proximal segment of left index finger  Wound explored and no tendon or muscle exposed. No foreign bodies visualized.  Psychiatric: She has a  normal mood and affect. Her behavior is normal.  Nursing note and vitals reviewed.   ED Course  Procedures (including critical care time) DIAGNOSTIC STUDIES: Oxygen Saturation is 97% on RA, normal by my interpretation.    COORDINATION OF CARE: 6:38 PM-Discussed treatment plan which includes tdap and laceration repair with pt at bedside and pt agreed to plan.   LACERATION REPAIR Performed by: Marlon Peliffany Shonette Rhames, PA-C Consent: Verbal consent obtained. Risks and benefits: risks, benefits and alternatives were discussed Patient identity confirmed: provided demographic data Time out performed prior to procedure Prepped and Draped in normal sterile fashion Wound explored Laceration Location: dorsal aspect of left index finger Laceration Length: 3  cm No Foreign Bodies seen or palpated Anesthesia: local infiltration Local anesthetic: lidocaine 2% w/o epinephrine Anesthetic total: 4 ml Irrigation method: syringe Amount of cleaning: standard Skin closure: 4.0 ethilon Number of sutures or staples: 4 Technique: simple interrupted Patient tolerance: Patient tolerated the procedure well with no immediate complications.  Labs Review Labs Reviewed - No data to display  Imaging Review No results found.   EKG Interpretation None      MDM   Final diagnoses:  Laceration    Patient g iven information about wound care, keep dry and clean. Sutures to be removed in 7-10 days. Tdap given in ED, wound edges clean and exxo knife was new, no indication for abx at this time.  Medications  lidocaine (XYLOCAINE) 2 % (with pres) injection 300 mg (not administered)  Tdap (BOOSTRIX) injection 0.5 mL (0.5 mLs Intramuscular Given 06/18/15 1825)  acetaminophen (TYLENOL) tablet 650 mg (650 mg Oral Given 06/18/15 1824)    24 y.o.Linda Huber's evaluation in the Emergency Department is complete. It has been determined that no acute conditions requiring further emergency intervention are present at this time. The patient/guardian have been advised of the diagnosis and plan. We have discussed signs and symptoms that warrant return to the ED, such as changes or worsening in symptoms.  Vital signs are stable at discharge. Filed Vitals:   06/18/15 1807  BP: 112/77  Pulse: 77  Temp: 97.8 F (36.6 C)  Resp: 16    Patient/guardian has voiced understanding and agreed to follow-up with the PCP or specialist.  I personally performed the services described in this documentation, which was scribed in my presence. The recorded information has been reviewed and is accurate.  Marlon Peliffany Beacher Every, PA-C 06/18/15 1931  Mancel BaleElliott Wentz, MD 06/18/15 [phone]

## 2015-06-18 NOTE — Discharge Instructions (Signed)

## 2015-06-18 NOTE — ED Notes (Signed)
Pt sts she cut L index finger with razor blade. Bleeding controlled.

## 2015-12-15 NOTE — L&D Delivery Note (Addendum)
Delivery Note At 7:49 PM a viable female, "Linda Huber", was delivered via Vaginal, Spontaneous Delivery (Presentation: ;  ).  APGAR: 8, 9; weight  .   Placenta status: Spontaneous, intact. Cord: WNL, with the following complications: None.  Cord pH: NA  Anesthesia:  None Episiotomy: None Lacerations: None Suture Repair: None Est. Blood Loss (mL):  150 cc  Mom to postpartum.  Baby to Couplet care / Skin to Skin. Patient undecided regarding contraception at present.  Nigel BridgemanLATHAM, [name] 08/12/2016, 8:06 PM

## 2015-12-19 ENCOUNTER — Inpatient Hospital Stay (HOSPITAL_COMMUNITY): Payer: Medicaid Other

## 2015-12-19 ENCOUNTER — Inpatient Hospital Stay (HOSPITAL_COMMUNITY)
Admission: AD | Admit: 2015-12-19 | Discharge: 2015-12-19 | Disposition: A | Discharge status: Home / Self Care | Payer: Medicaid Other | Source: Ambulatory Visit | Attending: Obstetrics and Gynecology | Admitting: Obstetrics and Gynecology

## 2015-12-19 ENCOUNTER — Encounter (HOSPITAL_COMMUNITY): Payer: Self-pay

## 2015-12-19 DIAGNOSIS — B9689 Other specified bacterial agents as the cause of diseases classified elsewhere: Secondary | ICD-10-CM

## 2015-12-19 DIAGNOSIS — R109 Unspecified abdominal pain: Secondary | ICD-10-CM | POA: Diagnosis present

## 2015-12-19 DIAGNOSIS — O26891 Other specified pregnancy related conditions, first trimester: Secondary | ICD-10-CM | POA: Insufficient documentation

## 2015-12-19 DIAGNOSIS — Z3A01 Less than 8 weeks gestation of pregnancy: Secondary | ICD-10-CM | POA: Diagnosis not present

## 2015-12-19 DIAGNOSIS — D573 Sickle-cell trait: Secondary | ICD-10-CM | POA: Insufficient documentation

## 2015-12-19 DIAGNOSIS — N760 Acute vaginitis: Secondary | ICD-10-CM | POA: Insufficient documentation

## 2015-12-19 DIAGNOSIS — O23591 Infection of other part of genital tract in pregnancy, first trimester: Secondary | ICD-10-CM | POA: Diagnosis not present

## 2015-12-19 DIAGNOSIS — R51 Headache: Secondary | ICD-10-CM | POA: Diagnosis not present

## 2015-12-19 DIAGNOSIS — M549 Dorsalgia, unspecified: Secondary | ICD-10-CM | POA: Diagnosis present

## 2015-12-19 DIAGNOSIS — N939 Abnormal uterine and vaginal bleeding, unspecified: Secondary | ICD-10-CM

## 2015-12-19 DIAGNOSIS — F329 Major depressive disorder, single episode, unspecified: Secondary | ICD-10-CM | POA: Diagnosis not present

## 2015-12-19 LAB — CBC
HCT: 34.5 % — ABNORMAL LOW (ref 36.0–46.0)
HEMOGLOBIN: 11.9 g/dL — AB (ref 12.0–15.0)
MCH: 29.4 pg (ref 26.0–34.0)
MCHC: 34.5 g/dL (ref 30.0–36.0)
MCV: 85.2 fL (ref 78.0–100.0)
PLATELETS: 210 10*3/uL (ref 150–400)
RBC: 4.05 MIL/uL (ref 3.87–5.11)
RDW: 13 % (ref 11.5–15.5)
WBC: 8.9 10*3/uL (ref 4.0–10.5)

## 2015-12-19 LAB — URINALYSIS, ROUTINE W REFLEX MICROSCOPIC
Bilirubin Urine: NEGATIVE
Glucose, UA: NEGATIVE mg/dL
Ketones, ur: NEGATIVE mg/dL
Leukocytes, UA: NEGATIVE
Nitrite: NEGATIVE
Protein, ur: NEGATIVE mg/dL
Specific Gravity, Urine: 1.02 (ref 1.005–1.030)
pH: 6 (ref 5.0–8.0)

## 2015-12-19 LAB — URINE MICROSCOPIC-ADD ON
BACTERIA UA: NONE SEEN
RBC / HPF: NONE SEEN RBC/hpf (ref 0–5)

## 2015-12-19 LAB — WET PREP, GENITAL
Sperm: NONE SEEN
Trich, Wet Prep: NONE SEEN
Yeast Wet Prep HPF POC: NONE SEEN

## 2015-12-19 LAB — POCT PREGNANCY, URINE: PREG TEST UR: POSITIVE — AB

## 2015-12-19 LAB — HCG, QUANTITATIVE, PREGNANCY: HCG, BETA CHAIN, QUANT, S: 15888 m[IU]/mL — AB (ref <5)

## 2015-12-19 MED ORDER — ONDANSETRON 4 MG PO TBDP: 4.0000 mg | ORAL_TABLET | Freq: Three times a day (TID) | ORAL | Status: DC | PRN

## 2015-12-19 MED ORDER — ACETAMINOPHEN 325 MG PO TABS
650.0000 mg | ORAL_TABLET | Freq: Four times a day (QID) | ORAL | Status: DC | PRN
  Administered 2015-12-19: 650 mg via ORAL
  Filled 2015-12-19: qty 2

## 2015-12-19 MED ORDER — METRONIDAZOLE 500 MG PO TABS: 500.0000 mg | ORAL_TABLET | Freq: Two times a day (BID) | ORAL | Status: DC

## 2015-12-19 NOTE — Discharge Instructions (Signed)
Abdominal Pain During Pregnancy °Abdominal pain is common in pregnancy. Most of the time, it does not cause harm. There are many causes of abdominal pain. Some causes are more serious than others. Some of the causes of abdominal pain in pregnancy are easily diagnosed. Occasionally, the diagnosis takes time to understand. Other times, the cause is not determined. Abdominal pain can be a sign that something is very wrong with the pregnancy, or the pain may have nothing to do with the pregnancy at all. For this reason, always tell your health care provider if you have any abdominal discomfort. °HOME CARE INSTRUCTIONS  °Monitor your abdominal pain for any changes. The following actions may help to alleviate any discomfort you are experiencing: °· Do not have sexual intercourse or put anything in your vagina until your symptoms go away completely. °· Get plenty of rest until your pain improves. °· Drink clear fluids if you feel nauseous. Avoid solid food as long as you are uncomfortable or nauseous. °· Only take over-the-counter or prescription medicine as directed by your health care provider. °· Keep all follow-up appointments with your health care provider. °SEEK IMMEDIATE MEDICAL CARE IF: °· You are bleeding, leaking fluid, or passing tissue from the vagina. °· You have increasing pain or cramping. °· You have persistent vomiting. °· You have painful or bloody urination. °· You have a fever. °· You notice a decrease in your baby's movements. °· You have extreme weakness or feel faint. °· You have shortness of breath, with or without abdominal pain. °· You develop a severe headache with abdominal pain. °· You have abnormal vaginal discharge with abdominal pain. °· You have persistent diarrhea. °· You have abdominal pain that continues even after rest, or gets worse. °MAKE SURE YOU:  °· Understand these instructions. °· Will watch your condition. °· Will get help right away if you are not doing well or get worse. °    °This information is not intended to replace advice given to you by your health care provider. Make sure you discuss any questions you have with your health care provider. °  °Document Released: 11/30/2005 Document Revised: 09/20/2013 Document Reviewed: 06/29/2013 °Elsevier Interactive Patient Education ©2016 [employer] Inc. ° ° ° °Bacterial Vaginosis °Bacterial vaginosis is a vaginal infection that occurs when the normal balance of bacteria in the vagina is disrupted. It results from an overgrowth of certain bacteria. This is the most common vaginal infection in women of childbearing age. Treatment is important to prevent complications, especially in pregnant women, as it can cause a premature delivery. °CAUSES  °Bacterial vaginosis is caused by an increase in harmful bacteria that are normally present in smaller amounts in the vagina. Several different kinds of bacteria can cause bacterial vaginosis. However, the reason that the condition develops is not fully understood. °RISK FACTORS °Certain activities or behaviors can put you at an increased risk of developing bacterial vaginosis, including: °· Having a new sex partner or multiple sex partners. °· Douching. °· Using an intrauterine device (IUD) for contraception. °Women do not get bacterial vaginosis from toilet seats, bedding, swimming pools, or contact with objects around them. °SIGNS AND SYMPTOMS  °Some women with bacterial vaginosis have no signs or symptoms. Common symptoms include: °· Grey vaginal discharge. °· A fishlike odor with discharge, especially after sexual intercourse. °· Itching or burning of the vagina and vulva. °· Burning or pain with urination. °DIAGNOSIS  °Your health care provider will take a medical history and examine the vagina for signs   of bacterial vaginosis. A sample of vaginal fluid may be taken. Your health care provider will look at this sample under a microscope to check for bacteria and abnormal cells. A vaginal pH test may  also be done.  °TREATMENT  °Bacterial vaginosis may be treated with antibiotic medicines. These may be given in the form of a pill or a vaginal cream. A second round of antibiotics may be prescribed if the condition comes back after treatment. Because bacterial vaginosis increases your risk for sexually transmitted diseases, getting treated can help reduce your risk for chlamydia, gonorrhea, HIV, and herpes. °HOME CARE INSTRUCTIONS  °· Only take over-the-counter or prescription medicines as directed by your health care provider. °· If antibiotic medicine was prescribed, take it as directed. Make sure you finish it even if you start to feel better. °· Tell all sexual partners that you have a vaginal infection. They should see their health care provider and be treated if they have problems, such as a mild rash or itching. °· During treatment, it is important that you follow these instructions: °¨ Avoid sexual activity or use condoms correctly. °¨ Do not douche. °¨ Avoid alcohol as directed by your health care provider. °¨ Avoid breastfeeding as directed by your health care provider. °SEEK MEDICAL CARE IF:  °· Your symptoms are not improving after 3 days of treatment. °· You have increased discharge or pain. °· You have a fever. °MAKE SURE YOU:  °· Understand these instructions. °· Will watch your condition. °· Will get help right away if you are not doing well or get worse. °FOR MORE INFORMATION  °Centers for Disease Control and Prevention, Division of STD Prevention: www.cdc.gov/std °American Sexual Health Association (ASHA): www.ashastd.org  °  °This information is not intended to replace advice given to you by your health care provider. Make sure you discuss any questions you have with your health care provider. °  °Document Released: 11/30/2005 Document Revised: 12/21/2014 Document Reviewed: 07/12/2013 °Elsevier Interactive Patient Education ©2016 Elsevier Inc. ° °

## 2015-12-19 NOTE — MAU Note (Signed)
Pt reports she has been having abd cramping since she has been awake. C/O feeling dizzy ans well.

## 2015-12-19 NOTE — MAU Note (Signed)
Notified provider that patient is here with c/o abd cramps and dizziness starting this morning. Pregnancy test positive. Provider said she would be down to see patient.

## 2015-12-19 NOTE — MAU Provider Note (Signed)
History    25 yo, G3P2002, 8.5wks by uncertain LMP of  10/19/15.  Presents unannounced to MAU for cramping, abdominal and back pain that is constant since yesterday. She also endorses dizziness, headache, and blurry vision for the past two days.  Vomited last night but states " food makes me nauseous".  Reports taking tylenol yesterday without relief. Reports a history of preterm delivery at 36 wks in her previous pregnancy. Is passing gas, denies difficulty urinating or defecating. Last bowel movement 2 days ago.  Reports irregular periods, took a pregnancy test on Chrsitmas day due to N&V and had a +UPT.   New OB interview scheduled at [facility] for February 2017- could not be seen in office earlier due to insurance issues.  She is in the process of switching to her husbands insurance and it is not effective until February.     Patient Active Problem List   Diagnosis Date Noted  . SGA (small for gestational age) 11/09/2014  . Sickle cell trait (HCC) 11/09/2014  . Chronic depression 11/09/2014  . Vaginal delivery 11/08/2014    Chief Complaint  Patient presents with  . Abdominal Cramping  . Dizziness   HPI  OB History    Gravida Para Term Preterm AB TAB SAB Ectopic Multiple Living   3 2 2       0 2      Past Medical History  Diagnosis Date  . Infection     UTI  . Depression     not on meds, doing good  . Urinary tract infection     Past Surgical History  Procedure Laterality Date  . Biopsy breast      Family History  Problem Relation Age of Onset  . Leukemia Paternal Grandmother   . Stroke Maternal Grandfather   . Asthma Mother   . Hypertension Mother   . Cancer Neg Hx   . Heart disease Neg Hx     Social History  Substance Use Topics  . Smoking status: Never Smoker   . Smokeless tobacco: Never Used  . Alcohol Use: Yes     Comment: not with + preg    Allergies: No Known Allergies  Prescriptions prior to admission  Medication Sig Dispense Refill Last Dose  .  ibuprofen (ADVIL,MOTRIN) 600 MG tablet Take 1 tablet (600 mg total) by mouth every 6 (six) hours as needed. (Patient not taking: Reported on 12/19/2015) 30 tablet 2 Completed Course at Unknown time  . Misc. Devices (BREAST PUMP) MISC 1 Units by Does not apply route as needed. (Patient not taking: Reported on 12/19/2015) 1 each 0 Completed Course at Unknown time    Review of Systems  Constitutional: Negative.   Eyes: Positive for blurred vision.  Respiratory: Negative.   Cardiovascular: Negative.   Gastrointestinal: Positive for nausea, vomiting and abdominal pain. Negative for diarrhea and constipation.  Genitourinary: Negative for dysuria, urgency, frequency, hematuria and flank pain.  Musculoskeletal: Positive for back pain.  Skin: Negative.   Neurological: Positive for dizziness and headaches.  Endo/Heme/Allergies: Negative.   Psychiatric/Behavioral: Negative.    Physical Exam   Blood pressure 111/76, pulse 83, temperature 98.4 F (36.9 C), temperature source Oral, resp. rate 18, height 5\' 5"  (1.651 m), weight 56.881 kg (125 lb 6.4 oz), last menstrual period 10/19/2015, not currently breastfeeding.  Results for orders placed or performed during the hospital encounter of 12/19/15 (from the past 24 hour(s))  Urinalysis, Routine w reflex microscopic (not at [facility])     Status: Abnormal  Collection Time: 12/19/15  4:05 PM  Result Value Ref Range   Color, Urine YELLOW YELLOW   APPearance CLEAR CLEAR   Specific Gravity, Urine 1.020 1.005 - 1.030   pH 6.0 5.0 - 8.0   Glucose, UA NEGATIVE NEGATIVE mg/dL   Hgb urine dipstick TRACE (A) NEGATIVE   Bilirubin Urine NEGATIVE NEGATIVE   Ketones, ur NEGATIVE NEGATIVE mg/dL   Protein, ur NEGATIVE NEGATIVE mg/dL   Nitrite NEGATIVE NEGATIVE   Leukocytes, UA NEGATIVE NEGATIVE  Urine microscopic-add on     Status: Abnormal   Collection Time: 12/19/15  4:05 PM  Result Value Ref Range   Squamous Epithelial / LPF 0-5 (A) NONE SEEN   WBC, UA 0-5 0 - 5  WBC/hpf   RBC / HPF NONE SEEN 0 - 5 RBC/hpf   Bacteria, UA NONE SEEN NONE SEEN  Pregnancy, urine POC     Status: Abnormal   Collection Time: 12/19/15  4:13 PM  Result Value Ref Range   Preg Test, Ur POSITIVE (A) NEGATIVE  Wet prep, genital     Status: Abnormal   Collection Time: 12/19/15  4:48 PM  Result Value Ref Range   Yeast Wet Prep HPF POC NONE SEEN NONE SEEN   Trich, Wet Prep NONE SEEN NONE SEEN   Clue Cells Wet Prep HPF POC PRESENT (A) NONE SEEN   WBC, Wet Prep HPF POC MANY (A) NONE SEEN   Sperm NONE SEEN   CBC     Status: Abnormal   Collection Time: 12/19/15  5:14 PM  Result Value Ref Range   WBC 8.9 4.0 - 10.5 K/uL   RBC 4.05 3.87 - 5.11 MIL/uL   Hemoglobin 11.9 (L) 12.0 - 15.0 g/dL   HCT 13.034.5 (L) 86.536.0 - 78.446.0 %   MCV 85.2 78.0 - 100.0 fL   MCH 29.4 26.0 - 34.0 pg   MCHC 34.5 30.0 - 36.0 g/dL   RDW 69.613.0 29.511.5 - 28.415.5 %   Platelets 210 150 - 400 K/uL  hCG, quantitative, pregnancy     Status: Abnormal   Collection Time: 12/19/15  5:14 PM  Result Value Ref Range   hCG, Beta Chain, Quant, S 15888 (H) <5 mIU/mL    Physical Exam  Constitutional: She is oriented to person, place, and time. She appears well-developed and well-nourished.  HENT:  Head: Normocephalic.  Eyes: Pupils are equal, round, and reactive to light.  Neck: Normal range of motion. Neck supple.  Cardiovascular: Normal rate, regular rhythm and normal heart sounds.   Respiratory: Effort normal and breath sounds normal.  GI: Soft. Bowel sounds are normal. She exhibits no distension. There is no tenderness. There is no rebound and no guarding.  Genitourinary: Uterus is tender. Cervix exhibits no motion tenderness, no discharge and no friability. Left adnexum displays tenderness. No tenderness or bleeding in the vagina. Vaginal discharge found.  Musculoskeletal: Normal range of motion.  Neurological: She is alert and oriented to person, place, and time. She has normal reflexes.  Skin: Skin is warm and dry.   Psychiatric: She has a normal mood and affect. Her behavior is normal. Judgment and thought content normal.    SVE: Closed/thick  US:    EXAM: OBSTETRIC <14 WK US AND TRANSVAGINAL OB US  TECHNIQUE: Both transabdominal and transvaginal ultrasound examinations were performed for complete evaluation of the gestation as well as the maternal uterus, adnexal regions, and pelvic cul-de-sac. Transvaginal technique was performed to assess early pregnancy.  COMPARISON: None. FINDINGS: Intrauterine gestational sac:  Single  Yolk sac: No Embryo: No Cardiac Activity: No Heart Rate: Not applicable bpm MSD: 6.3 mm 5 w 2 d Subchorionic hemorrhage: None visualized. Maternal uterus/adnexae: Subchorionic hemorrhage: None Right ovary: Normal Left ovary: Normal. Corpus luteum visualize. Other :None Free fluid: Trace free fluid identified within the pelvis.  IMPRESSION: 1. Probable early intrauterine gestational sac, but no yolk sac, fetal pole, or cardiac activity yet visualized. Recommend follow-up quantitative B-HCG levels and follow-up US in 14 days to confirm and assess viability. This recommendation follows SRU consensus guidelines: Diagnostic Criteria for Nonviable Pregnancy Early in the First Trimester. Malva Limes Med 2013; 161:0960-45.    ED Course  Assessment: EGA 5.2 wks Intauterine gestational sac visualized No yolk sac,embryo, or cardiac active Bacterial Vaginosis Headache/blurry vision improved with Tylenol   Plan: -Tylenol for HA/Discomfort -Enourage to increase water intake -Metrodinazole 500mg  PO BIDx7 days ( per up to date/CDC- OK in early pregnancy) - Pt preferred oral medication over vaginal gel -Zofran 4 mg ODT nausea -Repeat HcG in 3 days  -Repeat US in 14 days   -Instructed to Call if  symptoms worsen or bleeding begins.  -Keep New Ob interview and New OB wu in February  Alphonzo Severance CNM, MSN 12/19/2015 5:20 PM

## 2015-12-20 LAB — GC/CHLAMYDIA PROBE AMP (~~LOC~~) NOT AT ARMC
Chlamydia: NEGATIVE
NEISSERIA GONORRHEA: NEGATIVE

## 2015-12-23 ENCOUNTER — Inpatient Hospital Stay (HOSPITAL_COMMUNITY)
Admission: AD | Admit: 2015-12-23 | Discharge: 2015-12-23 | Disposition: A | Discharge status: Home / Self Care | Payer: Medicaid Other | Source: Ambulatory Visit | Attending: Obstetrics & Gynecology | Admitting: Obstetrics & Gynecology

## 2015-12-23 DIAGNOSIS — Z3A10 10 weeks gestation of pregnancy: Secondary | ICD-10-CM | POA: Insufficient documentation

## 2015-12-23 DIAGNOSIS — Z3491 Encounter for supervision of normal pregnancy, unspecified, first trimester: Secondary | ICD-10-CM | POA: Diagnosis present

## 2015-12-23 LAB — HCG, QUANTITATIVE, PREGNANCY: HCG, BETA CHAIN, QUANT, S: 15996 m[IU]/mL — AB (ref <5)

## 2015-12-23 NOTE — MAU Note (Signed)
Pt here for repeat BHCG today, pt denies pain or bleeding.

## 2015-12-26 ENCOUNTER — Inpatient Hospital Stay (HOSPITAL_COMMUNITY)
Admission: AD | Admit: 2015-12-26 | Discharge: 2015-12-26 | Disposition: A | Discharge status: Home / Self Care | Payer: Medicaid Other | Source: Ambulatory Visit | Attending: Obstetrics and Gynecology | Admitting: Obstetrics and Gynecology

## 2015-12-26 ENCOUNTER — Inpatient Hospital Stay (HOSPITAL_COMMUNITY): Payer: Medicaid Other

## 2015-12-26 DIAGNOSIS — Z36 Encounter for antenatal screening of mother: Secondary | ICD-10-CM | POA: Insufficient documentation

## 2015-12-26 DIAGNOSIS — Z3A01 Less than 8 weeks gestation of pregnancy: Secondary | ICD-10-CM | POA: Insufficient documentation

## 2015-12-26 DIAGNOSIS — O3481 Maternal care for other abnormalities of pelvic organs, first trimester: Secondary | ICD-10-CM | POA: Insufficient documentation

## 2015-12-26 DIAGNOSIS — N8312 Corpus luteum cyst of left ovary: Secondary | ICD-10-CM | POA: Diagnosis not present

## 2015-12-26 DIAGNOSIS — O3680X0 Pregnancy with inconclusive fetal viability, not applicable or unspecified: Secondary | ICD-10-CM

## 2015-12-26 LAB — HCG, QUANTITATIVE, PREGNANCY: hCG, Beta Chain, Quant, S: 22056 m[IU]/mL — ABNORMAL HIGH (ref <5)

## 2015-12-26 NOTE — MAU Note (Signed)
Pt presents to MAU for repeat labs, quant. Denies any vaginal bleeding or abnormal discharge. Denies pain

## 2015-12-26 NOTE — MAU Provider Note (Signed)
MAU Addendum Note [facility]  Pt repeated quant SP 12/19/15 [id number], 12/23/15 15996  Results for orders placed or performed during the hospital encounter of 12/26/15 (from the past 24 hour(s))  hCG, quantitative, pregnancy     Status: Abnormal   Collection Time: 12/26/15  6:32 PM  Result Value Ref Range   hCG, Beta Chain, Quant, S 22056 (H) <5 mIU/mL     Plan: US for viability   Linda Huber, CNM, MSN 12/26/2015. 8:57 PM     MAU Addendum Note 10:13pm   US yolk sac seen, embryo seen, FHR 114, EDD 08/21/16, GA 5.6weeks, Small SCH,    Plan: -Discussed need to follow up in office on Feb 2nd after insurance is active for FU quant and US if needed -Bleeding and PTL Precautions -Encouraged to call if any questions or concerns arise prior to next scheduled office visit.  -Discharged to home in stable condition    Linda Huber, CNM, MSN 12/26/2015. 10:13 PM

## 2015-12-26 NOTE — Discharge Instructions (Signed)

## 2016-01-27 ENCOUNTER — Encounter (HOSPITAL_COMMUNITY): Payer: Self-pay | Admitting: *Deleted

## 2016-01-27 ENCOUNTER — Inpatient Hospital Stay (HOSPITAL_COMMUNITY)
Admission: AD | Admit: 2016-01-27 | Discharge: 2016-01-27 | Disposition: A | Discharge status: Home / Self Care | Payer: Medicaid Other | Source: Ambulatory Visit | Attending: Obstetrics & Gynecology | Admitting: Obstetrics & Gynecology

## 2016-01-27 DIAGNOSIS — O210 Mild hyperemesis gravidarum: Secondary | ICD-10-CM | POA: Insufficient documentation

## 2016-01-27 DIAGNOSIS — Z3A10 10 weeks gestation of pregnancy: Secondary | ICD-10-CM | POA: Diagnosis not present

## 2016-01-27 DIAGNOSIS — O219 Vomiting of pregnancy, unspecified: Secondary | ICD-10-CM

## 2016-01-27 LAB — URINALYSIS, ROUTINE W REFLEX MICROSCOPIC
BILIRUBIN URINE: NEGATIVE
GLUCOSE, UA: NEGATIVE mg/dL
KETONES UR: NEGATIVE mg/dL
Leukocytes, UA: NEGATIVE
Nitrite: NEGATIVE
PROTEIN: NEGATIVE mg/dL
Specific Gravity, Urine: <1.005 — ABNORMAL LOW (ref 1.005–1.030)
pH: 6.5 (ref 5.0–8.0)

## 2016-01-27 LAB — URINE MICROSCOPIC-ADD ON

## 2016-01-27 MED ORDER — METOCLOPRAMIDE HCL 5 MG/ML IJ SOLN
10.0000 mg | Freq: Once | INTRAMUSCULAR | Status: AC
  Administered 2016-01-27: 10 mg via INTRAVENOUS
  Filled 2016-01-27: qty 2

## 2016-01-27 MED ORDER — PROMETHAZINE HCL 25 MG PO TABS
25.0000 mg | ORAL_TABLET | Freq: Once | ORAL | Status: AC
  Administered 2016-01-27: 25 mg via ORAL
  Filled 2016-01-27: qty 1

## 2016-01-27 MED ORDER — LACTATED RINGERS IV BOLUS (SEPSIS)
500.0000 mL | Freq: Once | INTRAVENOUS | Status: AC
  Administered 2016-01-27: 22:00:00 via INTRAVENOUS

## 2016-01-27 MED ORDER — LACTATED RINGERS IV SOLN
INTRAVENOUS | Status: DC
Start: 2016-01-27 — End: 2016-01-28

## 2016-01-27 NOTE — Discharge Instructions (Signed)

## 2016-01-27 NOTE — MAU Note (Addendum)
Pretty much vomiting all day. Unable to retain any foods or liquids.  When she does eat she gets sharp pains in lower abd. Prescribed zofran, doesn't really seem to help.  Has been ongoing for about a wk

## 2016-01-27 NOTE — MAU Note (Signed)
Had a fever yesterday, kids have colds, she denies any other symptoms.

## 2016-01-27 NOTE — MAU Provider Note (Signed)
History    Linda Huber is a 24y.o. [id number] at 10.3wks who presents, unannounced, for n/v and abdominal pain.  Patient reports inability to "retain any food since this morning."  Patient denies current diarrhea, fever, or other flu-like symptoms, but reports fever yesterday of 100 and did not treat.  Further reports household with "cold."  Patient reports abdominal pain that is "sharp and starts whenever I eat."  Patient further reports burning sensation that radiates to the back. Denies h/o stomach problems, crohns, or other bowel issues.  Denies constipation. Denies vaginal bleeding. Reports eating around 4pm without success--attempted quesdilla and water. Reports taking zofran with no relief. Last vomiting 30-40 minutes ago.   Patient Active Problem List   Diagnosis Date Noted  . SGA (small for gestational age) 11/09/2014  . Sickle cell trait (HCC) 11/09/2014  . Chronic depression 11/09/2014  . Vaginal delivery 11/08/2014    Chief Complaint  Patient presents with  . Morning Sickness   HPI  OB History    Gravida Para Term Preterm AB TAB SAB Ectopic Multiple Living   0 2      Past Medical History  Diagnosis Date  . Infection     UTI  . Depression     not on meds, doing good  . Urinary tract infection     Past Surgical History  Procedure Laterality Date  . Biopsy breast      Family History  Problem Relation Age of Onset  . Leukemia Paternal Grandmother   . Stroke Maternal Grandfather   . Asthma Mother   . Hypertension Mother   . Cancer Neg Hx   . Heart disease Neg Hx     Social History  Substance Use Topics  . Smoking status: Never Smoker   . Smokeless tobacco: Never Used  . Alcohol Use: Yes     Comment: not with + preg    Allergies: No Known Allergies  Prescriptions prior to admission  Medication Sig Dispense Refill Last Dose  . metroNIDAZOLE (FLAGYL) 500 MG tablet Take 1 tablet (500 mg total) by mouth 2 (two) times daily. 14 tablet 0   .  ondansetron (ZOFRAN ODT) 4 MG disintegrating tablet Take 1 tablet (4 mg total) by mouth every 8 (eight) hours as needed for nausea or vomiting. 90 tablet 1     ROS  See HPI Above Physical Exam   Blood pressure 116/71, pulse 102, temperature 98.1 F (36.7 C), temperature source Oral, resp. rate 18, weight 56.609 kg (124 lb 12.8 oz), last menstrual period 10/19/2015, SpO2 99 %, not currently breastfeeding.  Results for orders placed or performed during the hospital encounter of 01/27/16 (from the past 24 hour(s))  Urinalysis, Routine w reflex microscopic (not at [facility])     Status: Abnormal   Collection Time: 01/27/16  6:40 PM  Result Value Ref Range   Color, Urine STRAW (A) YELLOW   APPearance CLEAR CLEAR   Specific Gravity, Urine <1.005 (L) 1.005 - 1.030   pH 6.5 5.0 - 8.0   Glucose, UA NEGATIVE NEGATIVE mg/dL   Hgb urine dipstick TRACE (A) NEGATIVE   Bilirubin Urine NEGATIVE NEGATIVE   Ketones, ur NEGATIVE NEGATIVE mg/dL   Protein, ur NEGATIVE NEGATIVE mg/dL   Nitrite NEGATIVE NEGATIVE   Leukocytes, UA NEGATIVE NEGATIVE  Urine microscopic-add on     Status: Abnormal   Collection Time: 01/27/16  6:40 PM  Result Value Ref Range   Squamous Epithelial /  LPF 0-5 (A) NONE SEEN   WBC, UA 0-5 0 - 5 WBC/hpf   RBC / HPF 0-5 0 - 5 RBC/hpf   Bacteria, UA RARE (A) NONE SEEN    Physical Exam  Vitals reviewed. Constitutional: She appears well-developed and well-nourished. No distress.  HENT:  Head: Normocephalic and atraumatic.  Eyes: Conjunctivae are normal.  Neck: Normal range of motion.  Cardiovascular: Normal rate.   Respiratory: Effort normal.  GI: Soft. There is no tenderness.  Musculoskeletal: Normal range of motion.  Neurological: She is alert.  Skin: Skin is warm and dry.     FHR: 175 by doppler  ED Course  Assessment: IUP at 10.3wks N/V  Plan: -Attempt Oral Hydration -Oral phenergan -PO challenge ~70minutes after phenergan administration  Follow Up (2105) -Nurse  reports patient unable to keep fluids or crackers down -Unsure if she kept phenergan down -UA returns normal-No ketones, SG <1.005 -Start IV -Give LR bolus f/b continued infusion -Reglan  IV  Follow Up (2225) -Reports improvement with IV bolus and meds -Able to consume crackers and ginger ale w/o issues -Instructed to take dose of zofran prior to hs -Keep appt as scheduled: 2/16 -Encouraged to call if any questions or concerns arise prior to next scheduled office visit.  -Discharged to home in improved condition  Cherre Robins CNM, MSN 01/27/2016 7:20 PM

## 2016-01-30 LAB — OB RESULTS CONSOLE GBS: STREP GROUP B AG: NEGATIVE

## 2016-01-30 LAB — OB RESULTS CONSOLE HEPATITIS B SURFACE ANTIGEN: HEP B S AG: NEGATIVE

## 2016-01-30 LAB — OB RESULTS CONSOLE GC/CHLAMYDIA
Chlamydia: NEGATIVE
Gonorrhea: NEGATIVE

## 2016-01-30 LAB — OB RESULTS CONSOLE HIV ANTIBODY (ROUTINE TESTING): HIV: NONREACTIVE

## 2016-01-30 LAB — OB RESULTS CONSOLE RUBELLA ANTIBODY, IGM: Rubella: IMMUNE

## 2016-01-30 LAB — OB RESULTS CONSOLE RPR: RPR: NONREACTIVE

## 2016-02-02 ENCOUNTER — Encounter (HOSPITAL_COMMUNITY): Payer: Self-pay | Admitting: *Deleted

## 2016-02-02 ENCOUNTER — Inpatient Hospital Stay (HOSPITAL_COMMUNITY)
Admission: AD | Admit: 2016-02-02 | Discharge: 2016-02-02 | Disposition: A | Discharge status: Home / Self Care | Payer: Medicaid Other | Source: Ambulatory Visit | Attending: Obstetrics & Gynecology | Admitting: Obstetrics & Gynecology

## 2016-02-02 DIAGNOSIS — O219 Vomiting of pregnancy, unspecified: Secondary | ICD-10-CM | POA: Diagnosis not present

## 2016-02-02 DIAGNOSIS — R112 Nausea with vomiting, unspecified: Secondary | ICD-10-CM | POA: Diagnosis present

## 2016-02-02 DIAGNOSIS — R197 Diarrhea, unspecified: Secondary | ICD-10-CM

## 2016-02-02 DIAGNOSIS — D573 Sickle-cell trait: Secondary | ICD-10-CM | POA: Insufficient documentation

## 2016-02-02 DIAGNOSIS — Z3A11 11 weeks gestation of pregnancy: Secondary | ICD-10-CM | POA: Diagnosis not present

## 2016-02-02 DIAGNOSIS — F329 Major depressive disorder, single episode, unspecified: Secondary | ICD-10-CM | POA: Insufficient documentation

## 2016-02-02 DIAGNOSIS — O26891 Other specified pregnancy related conditions, first trimester: Secondary | ICD-10-CM | POA: Diagnosis not present

## 2016-02-02 DIAGNOSIS — R109 Unspecified abdominal pain: Secondary | ICD-10-CM | POA: Diagnosis not present

## 2016-02-02 DIAGNOSIS — R0981 Nasal congestion: Secondary | ICD-10-CM | POA: Insufficient documentation

## 2016-02-02 LAB — URINALYSIS, ROUTINE W REFLEX MICROSCOPIC
Bilirubin Urine: NEGATIVE
GLUCOSE, UA: NEGATIVE mg/dL
Hgb urine dipstick: NEGATIVE
Ketones, ur: NEGATIVE mg/dL
LEUKOCYTES UA: NEGATIVE
Nitrite: NEGATIVE
PH: 6 (ref 5.0–8.0)
Protein, ur: NEGATIVE mg/dL
SPECIFIC GRAVITY, URINE: 1.015 (ref 1.005–1.030)

## 2016-02-02 LAB — CBC WITH DIFFERENTIAL/PLATELET
Basophils Absolute: 0 10*3/uL (ref 0.0–0.1)
Basophils Relative: 0 %
Eosinophils Absolute: 0.1 10*3/uL (ref 0.0–0.7)
Eosinophils Relative: 1 %
HEMATOCRIT: 32.6 % — AB (ref 36.0–46.0)
HEMOGLOBIN: 11.6 g/dL — AB (ref 12.0–15.0)
LYMPHS ABS: 2.4 10*3/uL (ref 0.7–4.0)
LYMPHS PCT: 27 %
MCH: 29.8 pg (ref 26.0–34.0)
MCHC: 35.6 g/dL (ref 30.0–36.0)
MCV: 83.8 fL (ref 78.0–100.0)
MONO ABS: 0.3 10*3/uL (ref 0.1–1.0)
MONOS PCT: 4 %
NEUTROS ABS: 5.9 10*3/uL (ref 1.7–7.7)
Neutrophils Relative %: 68 %
Platelets: 195 10*3/uL (ref 150–400)
RBC: 3.89 MIL/uL (ref 3.87–5.11)
RDW: 12.9 % (ref 11.5–15.5)
WBC: 8.8 10*3/uL (ref 4.0–10.5)

## 2016-02-02 NOTE — Discharge Instructions (Signed)

## 2016-02-02 NOTE — MAU Note (Signed)
Patient presents to mau with c/o nausea and vomiting since Friday but none today; endorses diarrhea since Friday.  Reports a temperature at home of 100.4 yesterday. Endorses cold sweats and congestions but no cough. Abdominal pain when eats that is cramping in nature--none at present--8/10 when happens.

## 2016-02-02 NOTE — Progress Notes (Signed)
Patient educated on contact precautions. Handout given. Patient verbalizes understanding at this time.

## 2016-02-02 NOTE — MAU Provider Note (Signed)
History    Linda Huber is a 24y.o. [id number] at 11.2wks who presents, unannounced, for n/v and diarrhea.  Patient reports fever at 0100 of 100.4 with tylenol dosing with good results and no other fevers since.  Patient reports diarrhea after eating and states she has been eating "bananas, applesauce, toast, and rice."  However, patient reports diarrhea has been consistent with consumption of Gatorade and water for hydration. Patient reports incident of vomiting around 6pm with nausea medication taken shortly after with good results.  Continues to report household with "cold."  Patient denies pain, but states abdominal pain after eating but relief with diarrhea.  Last consumption around "4 hours ago" and she had soup that she was able to keep down.  Patient states she has had some congestion and has been taking mucinex with no relief.    Patient Active Problem List   Diagnosis Date Noted  . SGA (small for gestational age) 11/09/2014  . Sickle cell trait (HCC) 11/09/2014  . Chronic depression 11/09/2014  . Vaginal delivery 11/08/2014    Chief Complaint  Patient presents with  . Diarrhea  . Nausea  . Fever  . Abdominal Pain   Diarrhea  This is a recurrent problem. The current episode started in the past 7 days. The problem occurs 5 to 10 times per day. The problem has been gradually worsening. The stool consistency is described as watery. Associated symptoms include abdominal pain, a fever and vomiting. Pertinent negatives include no coughing. Risk factors include ill contacts. She has tried increased fluids and change of diet for the symptoms. The treatment provided mild relief.  Fever  This is a recurrent problem. The current episode started in the past 7 days. The problem occurs intermittently. The maximum temperature noted was 100 to 100.9 F. The temperature was taken using an axillary reading. Associated symptoms include abdominal pain, congestion, diarrhea, nausea and vomiting. Pertinent  negatives include no chest pain, coughing or sore throat. She has tried acetaminophen for the symptoms. The treatment provided significant relief.  Abdominal Pain This is a recurrent problem. The current episode started in the past 7 days. The onset quality is undetermined. The problem occurs intermittently. The problem has been waxing and waning. The pain is located in the generalized abdominal region. The quality of the pain is cramping. The abdominal pain does not radiate. Associated symptoms include diarrhea, a fever, nausea and vomiting. The pain is aggravated by eating. The pain is relieved by bowel movements. She has tried nothing for the symptoms.    OB History    Gravida Para Term Preterm AB TAB SAB Ectopic Multiple Living   0 2      Past Medical History  Diagnosis Date  . Infection     UTI  . Depression     not on meds, doing good  . Urinary tract infection     Past Surgical History  Procedure Laterality Date  . Biopsy breast      Family History  Problem Relation Age of Onset  . Leukemia Paternal Grandmother   . Stroke Maternal Grandfather   . Asthma Mother   . Hypertension Mother   . Cancer Neg Hx   . Heart disease Neg Hx     Social History  Substance Use Topics  . Smoking status: Never Smoker   . Smokeless tobacco: Never Used  . Alcohol Use: Yes     Comment: not with + preg  Allergies: No Known Allergies  Prescriptions prior to admission  Medication Sig Dispense Refill Last Dose  . ondansetron (ZOFRAN ODT) 4 MG disintegrating tablet Take 1 tablet (4 mg total) by mouth every 8 (eight) hours as needed for nausea or vomiting. 90 tablet 1 02/02/2016 at Unknown time  . Prenatal Vit-Fe Fumarate-FA (PRENATAL MULTIVITAMIN) TABS tablet Take 1 tablet by mouth daily at 12 noon.   02/02/2016 at Unknown time    Review of Systems  Constitutional: Positive for fever.  HENT: Positive for congestion. Negative for sore throat.   Respiratory: Negative for  cough.   Cardiovascular: Negative for chest pain.  Gastrointestinal: Positive for nausea, vomiting, abdominal pain and diarrhea.    See HPI Above Physical Exam   Blood pressure 109/75, pulse 86, temperature 97.6 F (36.4 C), temperature source Oral, resp. rate 17, height  (1.651 m), weight 57.425 kg (126 lb 9.6 oz), last menstrual period 10/19/2015, SpO2 99 %, not currently breastfeeding.  Results for orders placed or performed during the hospital encounter of 02/02/16 (from the past 24 hour(s))  Urinalysis, Routine w reflex microscopic (not at [facility])     Status: None   Collection Time: 02/02/16  8:40 PM  Result Value Ref Range   Color, Urine YELLOW YELLOW   APPearance CLEAR CLEAR   Specific Gravity, Urine 1.015 1.005 - 1.030   pH 6.0 5.0 - 8.0   Glucose, UA NEGATIVE NEGATIVE mg/dL   Hgb urine dipstick NEGATIVE NEGATIVE   Bilirubin Urine NEGATIVE NEGATIVE   Ketones, ur NEGATIVE NEGATIVE mg/dL   Protein, ur NEGATIVE NEGATIVE mg/dL   Nitrite NEGATIVE NEGATIVE   Leukocytes, UA NEGATIVE NEGATIVE  CBC with Differential/Platelet     Status: Abnormal (Preliminary result)   Collection Time: 02/02/16  9:42 PM  Result Value Ref Range   WBC 8.8 4.0 - 10.5 K/uL   RBC 3.89 3.87 - 5.11 MIL/uL   Hemoglobin 11.6 (L) 12.0 - 15.0 g/dL   HCT 16.1 (L) 09.6 - 04.5 %   MCV 83.8 78.0 - 100.0 fL   MCH 29.8 26.0 - 34.0 pg   MCHC 35.6 30.0 - 36.0 g/dL   RDW 40.9 81.1 - 91.4 %   Platelets 195 150 - 400 K/uL   Neutrophils Relative % 68 %   Neutro Abs 5.9 1.7 - 7.7 K/uL   Lymphocytes Relative 27 %   Lymphs Abs 2.4 0.7 - 4.0 K/uL   Monocytes Relative 4 %   Monocytes Absolute 0.3 0.1 - 1.0 K/uL   Eosinophils Relative 1 %   Eosinophils Absolute 0.1 0.0 - 0.7 K/uL   Basophils Relative 0 %   Basophils Absolute 0.0 0.0 - 0.1 K/uL   Other PENDING %    Physical Exam  Vitals reviewed. Constitutional: She appears well-developed and well-nourished. No distress.  HENT:  Head: Normocephalic and  atraumatic.  Eyes: Conjunctivae are normal.  Neck: Normal range of motion.  Cardiovascular: Normal rate.   Respiratory: Effort normal.  GI: Soft. There is no tenderness.  Musculoskeletal: Normal range of motion.  Neurological: She is alert.  Skin: Skin is warm and dry.     FHR: 151 by doppler  ED Course  Assessment: IUP at 11.2wks N/V Diarrhea Congestion Abdominal Pain  Plan: -Influenza and CBC with Diff -Educated regarding home mgmt for suspected norovirus +Hydration +Rest +Treatment of symptoms as appropriate -Encouraged to call if any questions or concerns arise prior to next scheduled office visit.  -Keep appt as scheduled: Feb 24 -Discharged to home  in stable condition  Cherre Robins CNM, MSN 02/02/2016 9:28 PM

## 2016-02-03 LAB — INFLUENZA PANEL BY PCR (TYPE A & B)
H1N1FLUPCR: NOT DETECTED
INFLBPCR: NEGATIVE
Influenza A By PCR: NEGATIVE

## 2016-03-14 ENCOUNTER — Emergency Department (HOSPITAL_COMMUNITY)
Admission: EM | Admit: 2016-03-14 | Discharge: 2016-03-14 | Disposition: A | Discharge status: Home / Self Care | Payer: Medicaid Other | Attending: Emergency Medicine | Admitting: Emergency Medicine

## 2016-03-14 ENCOUNTER — Encounter (HOSPITAL_COMMUNITY): Payer: Self-pay

## 2016-03-14 DIAGNOSIS — Z87440 Personal history of urinary (tract) infections: Secondary | ICD-10-CM | POA: Insufficient documentation

## 2016-03-14 DIAGNOSIS — Z8659 Personal history of other mental and behavioral disorders: Secondary | ICD-10-CM | POA: Insufficient documentation

## 2016-03-14 DIAGNOSIS — O210 Mild hyperemesis gravidarum: Secondary | ICD-10-CM | POA: Diagnosis not present

## 2016-03-14 DIAGNOSIS — Z3A17 17 weeks gestation of pregnancy: Secondary | ICD-10-CM | POA: Insufficient documentation

## 2016-03-14 DIAGNOSIS — Z79899 Other long term (current) drug therapy: Secondary | ICD-10-CM | POA: Diagnosis not present

## 2016-03-14 DIAGNOSIS — R112 Nausea with vomiting, unspecified: Secondary | ICD-10-CM

## 2016-03-14 LAB — I-STAT CHEM 8, ED
BUN: 5 mg/dL — AB (ref 6–20)
CALCIUM ION: 1.19 mmol/L (ref 1.12–1.23)
Chloride: 105 mmol/L (ref 101–111)
Creatinine, Ser: 0.4 mg/dL — ABNORMAL LOW (ref 0.44–1.00)
GLUCOSE: 83 mg/dL (ref 65–99)
HCT: 41 % (ref 36.0–46.0)
Hemoglobin: 13.9 g/dL (ref 12.0–15.0)
Potassium: 3.7 mmol/L (ref 3.5–5.1)
Sodium: 141 mmol/L (ref 135–145)
TCO2: 23 mmol/L (ref 0–100)

## 2016-03-14 MED ORDER — ONDANSETRON 4 MG PO TBDP: 4.0000 mg | ORAL_TABLET | Freq: Three times a day (TID) | ORAL | Status: DC | PRN

## 2016-03-14 MED ORDER — METOCLOPRAMIDE HCL 5 MG/ML IJ SOLN
10.0000 mg | Freq: Once | INTRAMUSCULAR | Status: AC
  Administered 2016-03-14: 10 mg via INTRAVENOUS
  Filled 2016-03-14: qty 2

## 2016-03-14 MED ORDER — SODIUM CHLORIDE 0.9 % IV BOLUS (SEPSIS)
1000.0000 mL | Freq: Once | INTRAVENOUS | Status: AC
  Administered 2016-03-14: 1000 mL via INTRAVENOUS

## 2016-03-14 MED ORDER — METOCLOPRAMIDE HCL 10 MG PO TABS: 10.0000 mg | ORAL_TABLET | Freq: Four times a day (QID) | ORAL | Status: DC

## 2016-03-14 NOTE — ED Notes (Signed)
Pt states yesterday she started to have vomiting and diarrhea. Pt has vomited 8x since then. No meds PTA. Pt is currently [redacted] weeks pregnant. Pt's family has had the same symptoms. On arrival pt is currently vomiting.

## 2016-03-14 NOTE — Discharge Instructions (Signed)
Nausea and Vomiting °Nausea is a sick feeling that often comes before throwing up (vomiting). Vomiting is a reflex where stomach contents come out of your mouth. Vomiting can cause severe loss of body fluids (dehydration). Children and elderly adults can become dehydrated quickly, especially if they also have diarrhea. Nausea and vomiting are symptoms of a condition or disease. It is important to find the cause of your symptoms. °CAUSES  °· Direct irritation of the stomach lining. This irritation can result from increased acid production (gastroesophageal reflux disease), infection, food poisoning, taking certain medicines (such as nonsteroidal anti-inflammatory drugs), alcohol use, or tobacco use. °· Signals from the brain. These signals could be caused by a headache, heat exposure, an inner ear disturbance, increased pressure in the brain from injury, infection, a tumor, or a concussion, pain, emotional stimulus, or metabolic problems. °· An obstruction in the gastrointestinal tract (bowel obstruction). °· Illnesses such as diabetes, hepatitis, gallbladder problems, appendicitis, kidney problems, cancer, sepsis, atypical symptoms of a heart attack, or eating disorders. °· Medical treatments such as chemotherapy and radiation. °· Receiving medicine that makes you sleep (general anesthetic) during surgery. °DIAGNOSIS °Your caregiver may ask for tests to be done if the problems do not improve after a few days. Tests may also be done if symptoms are severe or if the reason for the nausea and vomiting is not clear. Tests may include: °· Urine tests. °· Blood tests. °· Stool tests. °· Cultures (to look for evidence of infection). °· X-rays or other imaging studies. °Test results can help your caregiver make decisions about treatment or the need for additional tests. °TREATMENT °You need to stay well hydrated. Drink frequently but in small amounts. You may wish to drink water, sports drinks, clear broth, or eat frozen  ice pops or gelatin dessert to help stay hydrated. When you eat, eating slowly may help prevent nausea. There are also some antinausea medicines that may help prevent nausea. °HOME CARE INSTRUCTIONS  °· Take all medicine as directed by your caregiver. °· If you do not have an appetite, do not force yourself to eat. However, you must continue to drink fluids. °· If you have an appetite, eat a normal diet unless your caregiver tells you differently. °· Eat a variety of complex carbohydrates (rice, wheat, potatoes, bread), lean meats, yogurt, fruits, and vegetables. °· Avoid high-fat foods because they are more difficult to digest. °· Drink enough water and fluids to keep your urine clear or pale yellow. °· If you are dehydrated, ask your caregiver for specific rehydration instructions. Signs of dehydration may include: °· Severe thirst. °· Dry lips and mouth. °· Dizziness. °· Dark urine. °· Decreasing urine frequency and amount. °· Confusion. °· Rapid breathing or pulse. °SEEK IMMEDIATE MEDICAL CARE IF:  °· You have blood or brown flecks (like coffee grounds) in your vomit. °· You have black or bloody stools. °· You have a severe headache or stiff neck. °· You are confused. °· You have severe abdominal pain. °· You have chest pain or trouble breathing. °· You do not urinate at least once every 8 hours. °· You develop cold or clammy skin. °· You continue to vomit for longer than 24 to 48 hours. °· You have a fever. °MAKE SURE YOU:  °· Understand these instructions. °· Will watch your condition. °· Will get help right away if you are not doing well or get worse. °  °This information is not intended to replace advice given to you by your health care provider. Make sure   you discuss any questions you have with your health care provider.   Document Released: 11/30/2005 Document Revised: 02/22/2012 Document Reviewed: 04/29/2011 Elsevier Interactive Patient Education 2016 [place] of  Pregnancy The second trimester is from week 13 through week 28, months 4 through 6. The second trimester is often a time when you feel your best. Your body has also adjusted to being pregnant, and you begin to feel better physically. Usually, morning sickness has lessened or quit completely, you may have more energy, and you may have an increase in appetite. The second trimester is also a time when the fetus is growing rapidly. At the end of the sixth month, the fetus is about 9 inches long and weighs about 1 pounds. You will likely begin to feel the baby move (quickening) between 18 and 20 weeks of the pregnancy. BODY CHANGES Your body goes through many changes during pregnancy. The changes vary from woman to woman.   Your weight will continue to increase. You will notice your lower abdomen bulging out.  You may begin to get stretch marks on your hips, abdomen, and breasts.  You may develop headaches that can be relieved by medicines approved by your health care provider.  You may urinate more often because the fetus is pressing on your bladder.  You may develop or continue to have heartburn as a result of your pregnancy.  You may develop constipation because certain hormones are causing the muscles that push waste through your intestines to slow down.  You may develop hemorrhoids or swollen, bulging veins (varicose veins).  You may have back pain because of the weight gain and pregnancy hormones relaxing your joints between the bones in your pelvis and as a result of a shift in weight and the muscles that support your balance.  Your breasts will continue to grow and be tender.  Your gums may bleed and may be sensitive to brushing and flossing.  Dark spots or blotches (chloasma, mask of pregnancy) may develop on your face. This will likely fade after the baby is born.  A dark line from your belly button to the pubic area (linea nigra) may appear. This will likely fade after the baby is  born.  You may have changes in your hair. These can include thickening of your hair, rapid growth, and changes in texture. Some women also have hair loss during or after pregnancy, or hair that feels dry or thin. Your hair will most likely return to normal after your baby is born. WHAT TO EXPECT AT YOUR PRENATAL VISITS During a routine prenatal visit:  You will be weighed to make sure you and the fetus are growing normally.  Your blood pressure will be taken.  Your abdomen will be measured to track your baby's growth.  The fetal heartbeat will be listened to.  Any test results from the previous visit will be discussed. Your health care provider may ask you:  How you are feeling.  If you are feeling the baby move.  If you have had any abnormal symptoms, such as leaking fluid, bleeding, severe headaches, or abdominal cramping.  If you are using any tobacco products, including cigarettes, chewing tobacco, and electronic cigarettes.  If you have any questions. Other tests that may be performed during your second trimester include:  Blood tests that check for:  Low iron levels (anemia).  Gestational diabetes (between 24 and 28 weeks).  Rh antibodies.  Urine tests to check for infections, diabetes, or protein  in the urine.  An ultrasound to confirm the proper growth and development of the baby.  An amniocentesis to check for possible genetic problems.  Fetal screens for spina bifida and Down syndrome.  HIV (human immunodeficiency virus) testing. Routine prenatal testing includes screening for HIV, unless you choose not to have this test. HOME CARE INSTRUCTIONS   Avoid all smoking, herbs, alcohol, and unprescribed drugs. These chemicals affect the formation and growth of the baby.  Do not use any tobacco products, including cigarettes, chewing tobacco, and electronic cigarettes. If you need help quitting, ask your health care provider. You may receive counseling support and  other resources to help you quit.  Follow your health care provider's instructions regarding medicine use. There are medicines that are either safe or unsafe to take during pregnancy.  Exercise only as directed by your health care provider. Experiencing uterine cramps is a good sign to stop exercising.  Continue to eat regular, healthy meals.  Wear a good support bra for breast tenderness.  Do not use hot tubs, steam rooms, or saunas.  Wear your seat belt at all times when driving.  Avoid raw meat, uncooked cheese, cat litter boxes, and soil used by cats. These carry germs that can cause birth defects in the baby.  Take your prenatal vitamins.  Take 1500-2000 mg of calcium daily starting at the 20th week of pregnancy until you deliver your baby.  Try taking a stool softener (if your health care provider approves) if you develop constipation. Eat more high-fiber foods, such as fresh vegetables or fruit and whole grains. Drink plenty of fluids to keep your urine clear or pale yellow.  Take warm sitz baths to soothe any pain or discomfort caused by hemorrhoids. Use hemorrhoid cream if your health care provider approves.  If you develop varicose veins, wear support hose. Elevate your feet for 15 minutes, 3-4 times a day. Limit salt in your diet.  Avoid heavy lifting, wear low heel shoes, and practice good posture.  Rest with your legs elevated if you have leg cramps or low back pain.  Visit your dentist if you have not gone yet during your pregnancy. Use a soft toothbrush to brush your teeth and be gentle when you floss.  A sexual relationship may be continued unless your health care provider directs you otherwise.  Continue to go to all your prenatal visits as directed by your health care provider. SEEK MEDICAL CARE IF:   You have dizziness.  You have mild pelvic cramps, pelvic pressure, or nagging pain in the abdominal area.  You have persistent nausea, vomiting, or  diarrhea.  You have a bad smelling vaginal discharge.  You have pain with urination. SEEK IMMEDIATE MEDICAL CARE IF:   You have a fever.  You are leaking fluid from your vagina.  You have spotting or bleeding from your vagina.  You have severe abdominal cramping or pain.  You have rapid weight gain or loss.  You have shortness of breath with chest pain.  You notice sudden or extreme swelling of your face, hands, ankles, feet, or legs.  You have not felt your baby move in over an hour.  You have severe headaches that do not go away with medicine.  You have vision changes.   This information is not intended to replace advice given to you by your health care provider. Make sure you discuss any questions you have with your health care provider.   Document Released: 11/24/2001 Document Revised: 12/21/2014 Document  Reviewed: 01/31/2013 °Elsevier Interactive Patient Education ©2016 [employer] ° °

## 2016-03-14 NOTE — ED Notes (Signed)
Pt drank sips of gatorade w/o emesis. She denies nausea as well.

## 2016-03-14 NOTE — ED Provider Notes (Signed)
CSN: [id number]     Arrival date & time 03/14/16  [id number] History   First MD Initiated Contact with Patient 03/14/16 [phone]     Chief Complaint  Patient presents with  . Emesis     (Consider location/radiation/quality/duration/timing/severity/associated sxs/prior Treatment) HPI   Linda Huber is a 25y.o. [id number] at 17 wks who presents to the ER with nausea and vomiting that began at 1 am today.  She estimates 8 episodes of NBNB emesis.  She is accompanied by her husband and her daughter who are sick with the same symptoms.  She denies any abdominal pain, cramping, contractions, vaginal bleeding. She does feel fetal movement. She states that she is concerned with her nausea and vomiting because she with this pregnancy she has already been  "admitted for IV fluids".  She has been given Zofran tablets at home by her OB provider.  She denies chest pain, shortness of breath, fever, chills, sweats headache, lightheadedness, dizziness, back pain.  No other acute complaints.  Past Medical History  Diagnosis Date  . Infection     UTI  . Depression     not on meds, doing good  . Urinary tract infection    Past Surgical History  Procedure Laterality Date  . Biopsy breast     Family History  Problem Relation Age of Onset  . Leukemia Paternal Grandmother   . Stroke Maternal Grandfather   . Asthma Mother   . Hypertension Mother   . Cancer Neg Hx   . Heart disease Neg Hx    Social History  Substance Use Topics  . Smoking status: Never Smoker   . Smokeless tobacco: Never Used  . Alcohol Use: Yes     Comment: not with + preg   OB History    Gravida Para Term Preterm AB TAB SAB Ectopic Multiple Living   3 2 2       0 2     Review of Systems  All other systems reviewed and are negative.     Allergies  Review of patient's allergies indicates no known allergies.  Home Medications   Prior to Admission medications   Medication Sig Start Date End Date Taking? Authorizing Provider   metoCLOPramide (REGLAN) 10 MG tablet Take 1 tablet (10 mg total) by mouth every 6 (six) hours. 03/14/16   Danelle BerryLeisa Haelie Clapp, PA-C  ondansetron (ZOFRAN ODT) 4 MG disintegrating tablet Take 1 tablet (4 mg total) by mouth every 8 (eight) hours as needed for nausea or vomiting. 03/14/16   Danelle BerryLeisa Colinda Barth, PA-C  Prenatal Vit-Fe Fumarate-FA (PRENATAL MULTIVITAMIN) TABS tablet Take 1 tablet by mouth daily at 12 noon.    Historical Provider, MD   BP 96/59 mmHg  Pulse 94  Temp(Src) 98.3 F (36.8 C) (Oral)  Resp 13  Wt 57.607 kg  SpO2 100%  LMP 10/19/2015 (Approximate) Physical Exam  Constitutional: She is oriented to person, place, and time. She appears well-developed and well-nourished. No distress.  Uncomfortable appearing female, holding emesis bag, non-toxic appearing  HENT:  Head: Normocephalic and atraumatic.  Nose: Nose normal.  Mouth/Throat: Oropharynx is clear and moist. No oropharyngeal exudate.  Eyes: Conjunctivae and EOM are normal. Pupils are equal, round, and reactive to light. Right eye exhibits no discharge. Left eye exhibits no discharge. No scleral icterus.  Neck: Normal range of motion. No tracheal deviation present.  Cardiovascular: Normal rate, regular rhythm, normal heart sounds and intact distal pulses.  Exam reveals no gallop and no friction rub.   No murmur  heard. Radial and PD pulses 2+ and symmetrical  Pulmonary/Chest: Effort normal and breath sounds normal. No stridor. No respiratory distress. She has no wheezes. She has no rales. She exhibits no tenderness.  Abdominal: Soft. Bowel sounds are normal. She exhibits no distension and no mass. There is no tenderness. There is no rebound and no guarding.  Fundal height palpated 2 cm inferior to umbilicus Fetal HR 145 with doppler Normal BS  Musculoskeletal: Normal range of motion. She exhibits no edema or tenderness.  Neurological: She is alert and oriented to person, place, and time. She exhibits normal muscle tone. Coordination  normal.  Skin: Skin is warm and dry. No rash noted. She is not diaphoretic. No erythema. No pallor.  Brisk capillary refill  Psychiatric: She has a normal mood and affect. Her behavior is normal. Judgment and thought content normal.  Nursing note and vitals reviewed.   ED Course  Procedures (including critical care time) Labs Review Labs Reviewed  I-STAT CHEM 8, ED - Abnormal; Notable for the following:    BUN 5 (*)    Creatinine, Ser 0.40 (*)    All other components within normal limits    Imaging Review No results found. I have personally reviewed and evaluated these images and lab results as part of my medical decision-making.   EKG Interpretation None      MDM   25 y/o female, [redacted] weeks pregnant, presents with N & V that began at 9pm last night.  No active emesis since being in the ER with her daughter and other family members ill with same symptoms. Pt has no abdominal pain, no cramping.  She is feeling fetal movement.  No vaginal discharge or bleeding.  Fetal HR 145 with doppler. She is non-toxic in appearance, oral mucosa moist.  Chem 8 unremarkable. She was able to tolerate gatorade in the ER after 1L IVF and reglan.  She has home zofran from [facility] provider, I have prescribed reglan to use as needed.  Pt with encouraged to contact OB/GYN for follow up or present to [facility] hospital if she has intractable vomiting.   She was d/c in good condition with her family.    Medications  sodium chloride 0.9 % bolus 1,000 mL (0 mLs Intravenous Stopped 03/14/16 0634)  metoCLOPramide (REGLAN) injection 10 mg (10 mg Intravenous Given 03/14/16 0533)     Final diagnoses:  Non-intractable vomiting with nausea, vomiting of unspecified type        Danelle Berry, PA-C 03/20/16 0244  Zadie Rhine, MD 03/20/16 1730

## 2016-08-05 ENCOUNTER — Encounter (HOSPITAL_COMMUNITY): Payer: Self-pay

## 2016-08-05 ENCOUNTER — Inpatient Hospital Stay (HOSPITAL_COMMUNITY)
Admission: AD | Admit: 2016-08-05 | Discharge: 2016-08-05 | Disposition: A | Discharge status: Home / Self Care | Payer: Medicaid Other | Source: Ambulatory Visit | Attending: Obstetrics and Gynecology | Admitting: Obstetrics and Gynecology

## 2016-08-05 DIAGNOSIS — O9989 Other specified diseases and conditions complicating pregnancy, childbirth and the puerperium: Secondary | ICD-10-CM | POA: Diagnosis not present

## 2016-08-05 DIAGNOSIS — M791 Myalgia: Secondary | ICD-10-CM | POA: Diagnosis not present

## 2016-08-05 DIAGNOSIS — O26893 Other specified pregnancy related conditions, third trimester: Secondary | ICD-10-CM | POA: Diagnosis not present

## 2016-08-05 DIAGNOSIS — R102 Pelvic and perineal pain: Secondary | ICD-10-CM | POA: Diagnosis not present

## 2016-08-05 DIAGNOSIS — F329 Major depressive disorder, single episode, unspecified: Secondary | ICD-10-CM | POA: Insufficient documentation

## 2016-08-05 DIAGNOSIS — O99343 Other mental disorders complicating pregnancy, third trimester: Secondary | ICD-10-CM | POA: Insufficient documentation

## 2016-08-05 DIAGNOSIS — M7918 Myalgia, other site: Secondary | ICD-10-CM

## 2016-08-05 DIAGNOSIS — R109 Unspecified abdominal pain: Secondary | ICD-10-CM | POA: Diagnosis present

## 2016-08-05 DIAGNOSIS — Z79899 Other long term (current) drug therapy: Secondary | ICD-10-CM | POA: Insufficient documentation

## 2016-08-05 DIAGNOSIS — Z3A37 37 weeks gestation of pregnancy: Secondary | ICD-10-CM | POA: Diagnosis not present

## 2016-08-05 MED ORDER — OXYCODONE-ACETAMINOPHEN 5-325 MG PO TABS: 1.0000 | ORAL_TABLET | Freq: Four times a day (QID) | ORAL | 0 refills | Status: DC | PRN

## 2016-08-05 NOTE — MAU Note (Signed)
Pt presents to MAU with lower abdominal pain that began two weeks ago. Pain is a constant sharp pain. Taking  Tylenol for pain Pain reduced with sitting. Denies leaking of fluid or bleeding. Was in the office yesterday, pt reports cultures and labs came back normal.

## 2016-08-05 NOTE — Discharge Instructions (Signed)
Reasons to return to MAU: ° °1.  Contractions are  5 minutes apart or less, each last 1 minute, these have been going on for 1-2 hours, and you cannot walk or talk during them °2.  You have a large gush of fluid, or a trickle of fluid that will not stop and you have to wear a pad °3.  You have bleeding that is bright red, heavier than spotting--like menstrual bleeding (spotting can be normal in early labor or after a check of your cervix) °4.  You do not feel the baby moving like he/she normally does ° °Pelvic pain (SPD) °Approved by the [employer] °Share ° ° °In this article °What is symphysis pubis dysfunction?  °What are the symptoms of SPD?  °What causes SPD?  °How is SPD diagnosed?  °How is SPD treated?  °What can I do to ease the pain of SPD?  °Will I recover from SPD after I’ve had my baby ?  °Where can I get help and support? °Video  °Your pelvic floor °Finding and exercising this crucial area. What is symphysis pubis dysfunction?Symphysis pubis dysfunction (SPD) is a problem with the pelvis. Your pelvis is mainly formed of two pubic bones that curve round to make a cradle shape. The pubic bones meet at the front of your pelvis, at a firm joint called the symphysis pubis.  ° °The joint's connection is made strong by a dense network of tough tissues (ligaments). During pregnancy, swelling and pain can make the symphysis pubis joint less stable, causing SPD. ° °Doctors and physiotherapists classify any type of pelvic pain during pregnancy as pelvic girdle pain (PGP).  ° °SPD is one type of pelvic girdle pain. Diastasis symphysis pubis (DSP) is another type of pelvic girdle pain, which is related to SPD. DSP happens when the gap in the symphysis pubis joint widens too far. DSP is rare, and can only be diagnosed by an X-ray, ultrasound scan or MRI scan. What are the symptoms of SPD? Pain in the pubic area and groin are the most common symptoms, though you may also notice:  ° °Back pain,  pain at the back of your pelvis or hip pain.  °Pain, along with a grinding or clicking sensation in your pubic area.  °Pain down the inside of your thighs or between your legs.  °Pain that's made worse by parting your legs, walking, going up or down stairs or moving around in bed.  °Pain that's worse at night and stops you from sleeping well. Getting up to go to the toilet in the middle of the night can be especially painful. ° °SPD can occur at any time during your pregnancy or after giving birth. You may notice it for the first time during the middle of your pregnancy. What causes SPD?During pregnancy, your body produces a hormone called relaxin, which softens your ligaments to help your baby pass through your pelvis. This means that the joints in your pelvis naturally become more lax.  ° °However, this flexibility doesn't necessarily cause the painful problems of SPD. Usually, your nerves and muscles are able to adapt and compensate for the greater flexibility in your joints. This means your body should cope well with the changes to your posture as your baby grows. ° °SPD is thought to happen when your body doesn't adapt so well to the stretchier, looser ligaments caused by relaxin. SPD can be triggered by: ° °the joints in your pelvis moving unevenly  °changes to the   way your muscles work to support your pelvic girdle joints  °one pelvic joint not working properly and causing knock-on pain in the other joints of your pelvis ° °These problems mean that your pelvis is not as stable as it should be, and this is what causes SPD. Physiotherapy is the best way to treat SPD, because it's about the relationship between your muscles and bones, rather than how lax your joints are. You're more likely to develop SPD if: ° °you had pelvic girdle pain or pelvic joint pain before you became pregnant  °you've had a previous injury to your pelvis  °you've had pelvic girdle pain in a previous pregnancy  °you have a high BMI and  were overweight before you became pregnant  °hypermobility in all your joints  °How is SPD diagnosed? Your doctor or midwife should refer you to a women’s health physiotherapist. Your physiotherapist will test the stability, movement and pain in your pelvic joints and muscles. How is SPD treated?SPD is managed in the same way as other pelvic girdle pain. Treatment includes:  ° °Exercises to strengthen your spinal, tummy, pelvic girdle, hip and pelvic floor muscles. These will improve the stability of your pelvis and back. You may need gentle, hands-on treatment of your hip, back or pelvis to correct stiffness or imbalance. Water gymnastics can sometimes help.  °Your physiotherapist should advise you on how to make daily activities less painful and on how to make the birth of your baby easier. Your midwife should help you to write a birth plan that takes into account your SPD symptoms.  °Acupuncture may help reduce the pain and is safe during pregnancy. Make sure your practitioner is trained and experienced in working with pregnant women.  °Other manual therapies, such as osteopathy may help. See a registered practitioner who is experienced in treating pregnant women.  °A pelvic support belt may give relief, particularly when you're exercising or active. °What can I do to ease the pain of SPD?  °Be as active as you can, but don't push yourself so far that it hurts.  °Stick to the pelvic floor and tummy exercises that your physiotherapist recommends.  °Ask for and accept offers of help with daily chores.  °Plan ahead so that you reduce the activities that cause you problems. You could use a rucksack to carry things around, both indoors and out.  °Take care to part your legs no further than your pain-free range, particularly when getting in and out of the car, bed or bath. If you are lying down, pull up your knees as far as you can to make it easier to part your legs. If you are sitting, try arching your back and  sticking your chest out before parting or moving your legs.  °Avoid activities that make your pain worse or that put your pelvis in an uneven position, such as sitting cross-legged or carrying your toddler on your hip. If something hurts, stop doing it. If the pain is allowed to flare up, it can take a long time to settle down again.  °Try to sleep on your side with legs bent and a pillow between your knees.  °Rest regularly or sit down for activities you would normally do standing, such as ironing. By sitting on a birth ball or by getting down on your hands and knees, you'll take the weight of your baby off your pelvis.  °Try not to do heavy lifting or pushing. Pushing supermarket trolleys can often make your pain   worse, so shop online or ask someone to shop for you.  °When climbing stairs, take one step at a time. Step up onto one step with your best leg and then bring your other leg to meet it. Repeat with each step.  °Avoid standing on one leg. When getting dressed, sit down to pull on your knickers or trousers. °Will I recover from SPD after I’ve had my baby ? You’re very likely to recover within a few weeks to a few months after your baby is born. If you can, carry on with physiotherapy after the birth. Try to get help with looking after your baby during the early weeks.  ° °You may find you get twinges every month just before your period is due. This is likely to be caused by hormones that have a similar effect to pregnancy hormones.  ° °If you have SPD in one pregnancy, it is more likely that you’ll have it next time you get pregnant. Ask your midwife to refer you to a physiotherapist early on. SPD may not necessarily be as bad next time if it is well managed from the start of pregnancy.  ° °You could consider giving yourself a bit of time from one pregnancy to the next. Losing excess weight, getting fit and waiting until your children can walk may help reduce the symptoms of getting any type of pelvic pain  next time. Where can I get help and support?The Association of Chartered Physiotherap[employer] in Women’s Health can provide a list of physiotherapists in your area. ° °You can get in touch with other women in your situation by contacting [employer], a charity that offers support to women with pelvic girdle pain, including SPD, or by visiting our community.  ° °See our photo guide to pregnancy stretches, designed to help relieve those aches and pains.  ° °Last reviewed: July 2015 ° °

## 2016-08-05 NOTE — MAU Provider Note (Signed)
Chief Complaint:  Abdominal Pain   First Provider Initiated Contact with Patient 08/05/16 [id number]      HPI: Linda LackFanny L Huber is a 25 y.o. G3P2002 at [id number] who presents to maternity admissions reporting constant pain in the front of her low abdomen that is sharp and increases with walking, getting out of bed, or changing positions in bed.  She was seen in the office yesterday and had lab work and urine testing.  She reports having some pelvic pain with previous pregnancies but not this severe.  She has tried Tylenol for pain and is drinking plenty of water but these are not helping.  The pain is gradually worsening since onset 3 weeks ago. She reports good fetal movement, denies regular contractions, LOF, vaginal bleeding, vaginal itching/burning, urinary symptoms, h/a, dizziness, n/v, or fever/chills.    HPI  Past Medical History: Past Medical History:  Diagnosis Date  . Depression    not on meds, doing good  . Infection    UTI  . Urinary tract infection     Past obstetric history: OB History  Gravida Para Term Preterm AB Living  3 2 2     2   SAB TAB Ectopic Multiple Live Births        0 2    # Outcome Date GA Lbr Len/2nd Weight Sex Delivery Anes PTL Lv  3 Current           2 Term 11/08/14 [id number] 12:26 / 00:07 5 lb 4 oz (2.38 kg) F Vag-Spont EPI  LIV  1 Term 02/2010    F Vag-Spont EPI N LIV      Past Surgical History: Past Surgical History:  Procedure Laterality Date  . BIOPSY BREAST    . WISDOM TOOTH EXTRACTION      Family History: Family History  Problem Relation Age of Onset  . Leukemia Paternal Grandmother   . Stroke Maternal Grandfather   . Asthma Mother   . Hypertension Mother   . Cancer Neg Hx   . Heart disease Neg Hx     Social History: Social History  Substance Use Topics  . Smoking status: Never Smoker  . Smokeless tobacco: Never Used  . Alcohol use Yes     Comment: not with + preg    Allergies: No Known Allergies  Meds:  Prescriptions Prior to  Admission  Medication Sig Dispense Refill Last Dose  . metoCLOPramide (REGLAN) 10 MG tablet Take 1 tablet (10 mg total) by mouth every 6 (six) hours. 30 tablet 0   . ondansetron (ZOFRAN ODT) 4 MG disintegrating tablet Take 1 tablet (4 mg total) by mouth every 8 (eight) hours as needed for nausea or vomiting. 30 tablet 0   . Prenatal Vit-Fe Fumarate-FA (PRENATAL MULTIVITAMIN) TABS tablet Take 1 tablet by mouth daily at 12 noon.   02/02/2016 at Unknown time    ROS:  Review of Systems  Constitutional: Negative for chills, fatigue and fever.  Eyes: Negative for visual disturbance.  Respiratory: Negative for shortness of breath.   Cardiovascular: Negative for chest pain.  Gastrointestinal: Negative for abdominal pain, nausea and vomiting.  Genitourinary: Positive for pelvic pain. Negative for difficulty urinating, dysuria, flank pain, vaginal bleeding, vaginal discharge and vaginal pain.  Neurological: Negative for dizziness and headaches.  Psychiatric/Behavioral: Negative.      I have reviewed patient's Past Medical Hx, Surgical Hx, Family Hx, Social Hx, medications and allergies.   Physical Exam   Patient Vitals for the past 24 hrs:  BP  Temp Temp src Pulse Resp Height Weight  08/05/16 0922 - - - - - 5\' 5"  (1.651 m) 153 lb (69.4 kg)  08/05/16 0910 101/66 97.8 F (36.6 C) Oral 89 18 - -   Constitutional: Well-developed, well-nourished female in no acute distress.  Cardiovascular: normal rate Respiratory: normal effort GI: Abd soft, non-tender, gravid appropriate for gestational age.  MS: Extremities nontender, no edema, normal ROM, significant tenderness to palpation of pubic symphysis Neurologic: Alert and oriented x 4.  GU: Neg CVAT.   Dilation: 1.5 Effacement (%): 50 Cervical Position: Posterior Station: -2 Exam by:: Guadlupe SpanishL. Seymour RN  FHT:  Baseline 135 , moderate variability, accelerations present, no decelerations Contractions: q 7-10 mins, mild to palpation   Labs: No  results found for this or any previous visit (from the past 24 hour(s)).    Imaging:  No results found.  MAU Course/MDM: Pain is located at pubic symphysis and is increased with walking, getting out of bed, and other position changes.  Likely pain of pubic symphysis, but no indication of severe separation of pubic symphysis at this time. No evidence of labor, NST reactive.  Consult Dr Su Hiltoberts.  D/C home.  Recommend pregnancy support belt, good ergonomic positions keeping legs together when changing positions, ice/heat/Tylenol.  Rx for Percocet 5/325, take 1-2 tabs Q 6 hours PRN x 10 tabs.  F/U in office as scheduled, labor precautions reviewed. Pt stable at time of discharge.  Assessment: 1. Pain in symphysis pubis during pregnancy   2. Pelvic pain affecting pregnancy in third trimester, antepartum     Plan: Discharge home Labor precautions and fetal kick counts Follow-up Information    LATHAM, VICKI, CNM .   Specialty:  Obstetrics and Gynecology Why:  As scheduled, return to MAU as needed for emergencies or signs of active labor Contact information: [address] [place] [id number] [phone]            Medication List    TAKE these medications   metoCLOPramide 10 MG tablet Commonly known as:  REGLAN Take 1 tablet (10 mg total) by mouth every 6 (six) hours.   ondansetron 4 MG disintegrating tablet Commonly known as:  ZOFRAN ODT Take 1 tablet (4 mg total) by mouth every 8 (eight) hours as needed for nausea or vomiting.   oxyCODONE-acetaminophen 5-325 MG tablet Commonly known as:  PERCOCET/ROXICET Take 1-2 tablets by mouth every 6 (six) hours as needed.   prenatal multivitamin Tabs tablet Take 1 tablet by mouth daily at 12 noon.       Sharen CounterLisa Leftwich-Kirby Certified Nurse-Midwife 08/05/2016 10:12 AM

## 2016-08-12 ENCOUNTER — Encounter (HOSPITAL_COMMUNITY): Payer: Self-pay

## 2016-08-12 ENCOUNTER — Inpatient Hospital Stay (HOSPITAL_COMMUNITY)
Admission: AD | Admit: 2016-08-12 | Discharge: 2016-08-14 | DRG: 775 | Disposition: A | Discharge status: Home / Self Care | Payer: Medicaid Other | Source: Ambulatory Visit | Attending: Obstetrics and Gynecology | Admitting: Obstetrics and Gynecology

## 2016-08-12 DIAGNOSIS — D573 Sickle-cell trait: Secondary | ICD-10-CM | POA: Diagnosis present

## 2016-08-12 DIAGNOSIS — Z3A38 38 weeks gestation of pregnancy: Secondary | ICD-10-CM

## 2016-08-12 DIAGNOSIS — Z823 Family history of stroke: Secondary | ICD-10-CM

## 2016-08-12 DIAGNOSIS — Z8249 Family history of ischemic heart disease and other diseases of the circulatory system: Secondary | ICD-10-CM

## 2016-08-12 DIAGNOSIS — O4202 Full-term premature rupture of membranes, onset of labor within 24 hours of rupture: Principal | ICD-10-CM | POA: Diagnosis present

## 2016-08-12 DIAGNOSIS — O9902 Anemia complicating childbirth: Secondary | ICD-10-CM | POA: Diagnosis present

## 2016-08-12 LAB — CBC
HCT: 31.2 % — ABNORMAL LOW (ref 36.0–46.0)
HEMOGLOBIN: 10.4 g/dL — AB (ref 12.0–15.0)
MCH: 25.5 pg — ABNORMAL LOW (ref 26.0–34.0)
MCHC: 33.3 g/dL (ref 30.0–36.0)
MCV: 76.5 fL — ABNORMAL LOW (ref 78.0–100.0)
Platelets: 241 10*3/uL (ref 150–400)
RBC: 4.08 MIL/uL (ref 3.87–5.11)
RDW: 14.2 % (ref 11.5–15.5)
WBC: 10.2 10*3/uL (ref 4.0–10.5)

## 2016-08-12 LAB — AMNISURE RUPTURE OF MEMBRANE (ROM) NOT AT ARMC: AMNISURE: POSITIVE

## 2016-08-12 LAB — TYPE AND SCREEN
ABO/RH(D): AB POS
ANTIBODY SCREEN: NEGATIVE

## 2016-08-12 LAB — POCT FERN TEST: POCT FERN TEST: NEGATIVE

## 2016-08-12 MED ORDER — TETANUS-DIPHTH-ACELL PERTUSSIS 5-2.5-18.5 LF-MCG/0.5 IM SUSP: 0.5000 mL | Freq: Once | INTRAMUSCULAR | Status: DC

## 2016-08-12 MED ORDER — SIMETHICONE 80 MG PO CHEW: 80.0000 mg | CHEWABLE_TABLET | ORAL | Status: DC | PRN

## 2016-08-12 MED ORDER — FLEET ENEMA 7-19 GM/118ML RE ENEM: 1.0000 | ENEMA | RECTAL | Status: DC | PRN

## 2016-08-12 MED ORDER — OXYTOCIN BOLUS FROM INFUSION
500.0000 mL | Freq: Once | INTRAVENOUS | Status: AC
  Administered 2016-08-12: 500 mL/h via INTRAVENOUS

## 2016-08-12 MED ORDER — SOD CITRATE-CITRIC ACID 500-334 MG/5ML PO SOLN: 30.0000 mL | ORAL | Status: DC | PRN

## 2016-08-12 MED ORDER — ZOLPIDEM TARTRATE 5 MG PO TABS: 5.0000 mg | ORAL_TABLET | Freq: Every evening | ORAL | Status: DC | PRN

## 2016-08-12 MED ORDER — TERBUTALINE SULFATE 1 MG/ML IJ SOLN
0.2500 mg | Freq: Once | INTRAMUSCULAR | Status: DC | PRN
  Filled 2016-08-12: qty 1

## 2016-08-12 MED ORDER — ONDANSETRON HCL 4 MG PO TABS: 4.0000 mg | ORAL_TABLET | ORAL | Status: DC | PRN

## 2016-08-12 MED ORDER — ONDANSETRON HCL 4 MG/2ML IJ SOLN: 4.0000 mg | INTRAMUSCULAR | Status: DC | PRN

## 2016-08-12 MED ORDER — BENZOCAINE-MENTHOL 20-0.5 % EX AERO
1.0000 "application " | INHALATION_SPRAY | CUTANEOUS | Status: DC | PRN
  Administered 2016-08-12: 1 via TOPICAL
  Filled 2016-08-12: qty 56

## 2016-08-12 MED ORDER — PRENATAL MULTIVITAMIN CH
1.0000 | ORAL_TABLET | Freq: Every day | ORAL | Status: DC
  Administered 2016-08-13 – 2016-08-14 (×2): 1 via ORAL
  Filled 2016-08-12 (×2): qty 1

## 2016-08-12 MED ORDER — WITCH HAZEL-GLYCERIN EX PADS: 1.0000 "application " | MEDICATED_PAD | CUTANEOUS | Status: DC | PRN

## 2016-08-12 MED ORDER — OXYCODONE-ACETAMINOPHEN 5-325 MG PO TABS: 1.0000 | ORAL_TABLET | ORAL | Status: DC | PRN

## 2016-08-12 MED ORDER — IBUPROFEN 600 MG PO TABS
600.0000 mg | ORAL_TABLET | Freq: Four times a day (QID) | ORAL | Status: DC
  Administered 2016-08-12 – 2016-08-14 (×7): 600 mg via ORAL
  Filled 2016-08-12 (×7): qty 1

## 2016-08-12 MED ORDER — ACETAMINOPHEN 325 MG PO TABS
650.0000 mg | ORAL_TABLET | ORAL | Status: DC | PRN
  Administered 2016-08-13: 650 mg via ORAL
  Filled 2016-08-12: qty 2

## 2016-08-12 MED ORDER — ONDANSETRON HCL 4 MG/2ML IJ SOLN: 4.0000 mg | Freq: Four times a day (QID) | INTRAMUSCULAR | Status: DC | PRN

## 2016-08-12 MED ORDER — SENNOSIDES-DOCUSATE SODIUM 8.6-50 MG PO TABS
2.0000 | ORAL_TABLET | ORAL | Status: DC
  Administered 2016-08-14: 2 via ORAL
  Filled 2016-08-12: qty 2

## 2016-08-12 MED ORDER — FENTANYL CITRATE (PF) 100 MCG/2ML IJ SOLN
50.0000 ug | INTRAMUSCULAR | Status: DC | PRN
  Administered 2016-08-12: 100 ug via INTRAVENOUS
  Filled 2016-08-12: qty 2

## 2016-08-12 MED ORDER — OXYCODONE HCL 5 MG PO TABS: 5.0000 mg | ORAL_TABLET | ORAL | Status: DC | PRN

## 2016-08-12 MED ORDER — OXYCODONE HCL 5 MG PO TABS: 10.0000 mg | ORAL_TABLET | ORAL | Status: DC | PRN

## 2016-08-12 MED ORDER — OXYCODONE-ACETAMINOPHEN 5-325 MG PO TABS
2.0000 | ORAL_TABLET | ORAL | Status: DC | PRN
Start: 2016-08-12 — End: 2016-08-12

## 2016-08-12 MED ORDER — LACTATED RINGERS IV SOLN
INTRAVENOUS | Status: DC
  Administered 2016-08-12 (×2): via INTRAVENOUS

## 2016-08-12 MED ORDER — ACETAMINOPHEN 325 MG PO TABS: 650.0000 mg | ORAL_TABLET | ORAL | Status: DC | PRN

## 2016-08-12 MED ORDER — DIBUCAINE 1 % RE OINT
1.0000 | TOPICAL_OINTMENT | RECTAL | Status: DC | PRN
Start: 2016-08-12 — End: 2016-08-14

## 2016-08-12 MED ORDER — DIPHENHYDRAMINE HCL 25 MG PO CAPS: 25.0000 mg | ORAL_CAPSULE | Freq: Four times a day (QID) | ORAL | Status: DC | PRN

## 2016-08-12 MED ORDER — OXYTOCIN 40 UNITS IN LACTATED RINGERS INFUSION - SIMPLE MED
1.0000 m[IU]/min | INTRAVENOUS | Status: DC
  Administered 2016-08-12: 1 m[IU]/min via INTRAVENOUS

## 2016-08-12 MED ORDER — LACTATED RINGERS IV SOLN: 500.0000 mL | INTRAVENOUS | Status: DC | PRN

## 2016-08-12 MED ORDER — LIDOCAINE HCL (PF) 1 % IJ SOLN
30.0000 mL | INTRAMUSCULAR | Status: DC | PRN
Start: 2016-08-12 — End: 2016-08-12
  Filled 2016-08-12: qty 30

## 2016-08-12 MED ORDER — COCONUT OIL OIL: 1.0000 "application " | TOPICAL_OIL | Status: DC | PRN

## 2016-08-12 MED ORDER — OXYTOCIN 40 UNITS IN LACTATED RINGERS INFUSION - SIMPLE MED
2.5000 [IU]/h | INTRAVENOUS | Status: DC
  Administered 2016-08-12: 2.5 [IU]/h via INTRAVENOUS
  Filled 2016-08-12: qty 1000

## 2016-08-12 NOTE — MAU Note (Signed)
Water broke around 745 this morning. No ctx felt so far.

## 2016-08-12 NOTE — H&P (Addendum)
Linda Huber is a 25 y.o. female presenting for LOF.  I was called by Huber in MAU who stated pt was negative for ferning but amnisure was positive.  No VB and good FM.    OB History    Gravida Para Term Preterm AB Living   4 2 2   1 2    SAB TAB Ectopic Multiple Live Births   1     0 2     Past Medical History:  Diagnosis Date  . Depression    not on meds, doing good  . Infection    UTI  . Urinary tract infection    Past Surgical History:  Procedure Laterality Date  . BIOPSY BREAST    . WISDOM TOOTH EXTRACTION     Family History: family history includes Asthma in her mother; Hypertension in her mother; Leukemia in her paternal grandmother; Stroke in her maternal grandfather. Social History:  reports that she has never smoked. She has never used smokeless tobacco. She reports that she drinks alcohol. She reports that she does not use drugs.     Maternal Diabetes: No Genetic Screening: Declined (although AFP neg) Maternal Ultrasounds/Referrals: Normal Fetal Ultrasounds or other Referrals:  None Maternal Substance Abuse:  No Significant Maternal Medications:  None Significant Maternal Lab Results:  Lab values include: Group B Strep negative Other Comments:  fetal pyelectasis on early u/s that resolved.  Mom is Sickle Cell trait +  ROS History Dilation: 2 Effacement (%): 50 Station: -3 Exam by:: Linda Huber Temperature 97.7 F (36.5 C), resp. rate 16, height 5\' 5"  (1.651 m), weight 153 lb (69.4 kg), last menstrual period 10/19/2015, not currently breastfeeding. Exam Physical Exam  Lungs CTA CV RRR Abd gravid, NT Ext no calf tenderness FHT 120s-130s, +accels, no decels, mod variability Toco occas  Prenatal labs: ABO, Rh:  AB + Antibody:  neg Rubella:  Immune RPR:   NR HBsAg:   Neg HIV:   NR GBS:   Neg  Assessment/Plan: P2 at 38 5/7 wks who presents with SROM not in labor.  Fetal status is overall reassuring.  Will cont to observe and augment if  necessary.   Linda Huber Y 08/12/2016, 11:57 AM   Pt feels cramping.  Will augment with pitocin.  Pain medicine options discussed.

## 2016-08-12 NOTE — Anesthesia Pain Management Evaluation Note (Signed)
  CRNA Pain Management Visit Note  Patient: Linda Huber, 25 y.o., female  "Hello I am a [facility] of the anesthesia [facility] at [facility]. We have an anesthesia team available at all times to provide care throughout the hospital, including epidural management and anesthesia for C-section. I don't know your plan for the delivery whether it a natural birth, water birth, IV sedation, nitrous supplementation, doula or epidural, but we want to meet your pain goals."   1.Was your pain managed to your expectations on prior hospitalizations?   Yes   2.What is your expectation for pain management during this hospitalization?     Labor support without medications  3.How can we help you reach that goal? Be available if needed  Record the patient's initial score and the patient's pain goal.   Pain: 8  Pain Goal: 8 [facility] wants you to be able to say your pain was always managed very well.  Connor Foxworthy 08/12/2016

## 2016-08-12 NOTE — Progress Notes (Signed)
Linda Huber is a 25 y.o. [id number] at 3257w5d admitted for rupture of membranes.  Subjective: No complaints.  Sitting on labor ball.  Objective: BP 111/68   Pulse 98   Temp 98.6 F (37 C) (Axillary)   Resp 16   Ht 5\' 5"  (1.651 m)   Wt 153 lb (69.4 kg)   LMP 10/19/2015 (Approximate)   BMI 25.46 kg/m  No intake/output data recorded. No intake/output data recorded.  FHT:  FHR: 130s-140 bpm, variability: moderate,  accelerations:  Present,  decelerations:  Absent UC:   regular, every 3-5 minutes SVE:   Dilation: 5 Effacement (%): 70 Station: -2 Exam by:: M.Merrill, RN  Labs: Lab Results  Component Value Date   WBC 10.2 08/12/2016   HGB 10.4 (L) 08/12/2016   HCT 31.2 (L) 08/12/2016   MCV 76.5 (L) 08/12/2016   PLT 241 08/12/2016    Assessment / Plan: Augmentation of labor, progressing well  Labor: Progressing normally Preeclampsia:  no signs or symptoms of toxicity Fetal Wellbeing:  Category I Pain Control:  Labor support without medications I/D:  GBS neg Anticipated MOD:  NSVD  Chaundra Abreu Y 08/12/2016, 6:45 PM

## 2016-08-13 LAB — CBC
HCT: 30.5 % — ABNORMAL LOW (ref 36.0–46.0)
Hemoglobin: 10.4 g/dL — ABNORMAL LOW (ref 12.0–15.0)
MCH: 26 pg (ref 26.0–34.0)
MCHC: 34.1 g/dL (ref 30.0–36.0)
MCV: 76.3 fL — ABNORMAL LOW (ref 78.0–100.0)
Platelets: 210 10*3/uL (ref 150–400)
RBC: 4 MIL/uL (ref 3.87–5.11)
RDW: 14.1 % (ref 11.5–15.5)
WBC: 14.4 10*3/uL — ABNORMAL HIGH (ref 4.0–10.5)

## 2016-08-13 LAB — RPR: RPR Ser Ql: NONREACTIVE

## 2016-08-13 NOTE — Lactation Note (Signed)
This note was copied from a baby's chart. Lactation Consultation Note  Patient Name: Girl Glory RosebushFanny Zamor WUJWJ'XToday's Date: 08/13/2016 Reason for consult: Initial assessment (experienced breast feeder of 2 others )  Baby is 416 hours old and has been to the breast several times , 2 voids and 1 stool.  Per mom feels breast feeding is going well and baby latched right after delivery.  Mom requested a hand pump. LC instructed mom on the hand pump for when she is D/C.  Mother informed of post-discharge support and given phone number to the lactation department, including  services for phone call assistance; out-patient appointments; and breastfeeding support group. List of other  breastfeeding resources in the community given in the handout. Encouraged mother to call for problems or  concerns related to breastfeeding. Baby latched easily at this consult - Latch score 10 , and per mom, baby still feeding at 15 mins, multiply swallows noted.    Maternal Data Does the patient have breastfeeding experience prior to this delivery?: Yes  Feeding Feeding Type: Breast Fed Length of feed:  (multiply swallows noted )  LATCH Score/Interventions Latch: Grasps breast easily, tongue down, lips flanged, rhythmical sucking. Intervention(s): Adjust position;Assist with latch;Breast massage;Breast compression  Audible Swallowing: Spontaneous and intermittent  Type of Nipple: Everted at rest and after stimulation  Comfort (Breast/Nipple): Soft / non-tender     Hold (Positioning): No assistance needed to correctly position infant at breast. Intervention(s): Breastfeeding basics reviewed;Support Pillows;Position options;Skin to skin  LATCH Score: 10  Lactation Tools Discussed/Used Tools: Pump (mom requested for D/C , ) Breast pump type: Manual WIC Program: No Pump Review: Setup, frequency, and cleaning Initiated by:: MAI  Date initiated:: 08/13/16   Consult Status Consult Status: Follow-up Date:  08/14/16 Follow-up type: In-patient    Kathrin Greathouseorio, Johncharles Fusselman Ann 08/13/2016, 11:52 AM

## 2016-08-13 NOTE — Progress Notes (Signed)
Post Partum Day 1 Subjective: no complaints, up ad lib, voiding and tolerating PO. Breast feeding.  Objective: Blood pressure (!) 106/47, pulse (!) 57, temperature 98.2 F (36.8 C), resp. rate 16, height 5\' 5"  (1.651 m), weight 153 lb (69.4 kg), last menstrual period 10/19/2015, SpO2 100 %, unknown if currently breastfeeding.  Physical Exam:  General: alert and no distress Lochia: appropriate Uterine Fundus: firm Incision: n/a DVT Evaluation: No evidence of DVT seen on physical exam.   Recent Labs  08/12/16 1255 08/13/16 0549  HGB 10.4* 10.4*  HCT 31.2* 30.5*    Assessment/Plan: Plan for discharge tomorrow, Breastfeeding and Contraception plans IUD Cont routine PP care   LOS: 1 day   Linda Huber Y 08/13/2016, 3:25 PM

## 2016-08-13 NOTE — Progress Notes (Signed)
MOB was referred for history of depression/anxiety.  Referral is screened out by Clinical Social Worker because none of the following criteria appear to apply and  there are no reports impacting the pregnancy or her transition to the postpartum period. CSW does not deem it clinically necessary to further investigate at this time.  -History of anxiety/depression during this pregnancy, or of post-partum depression.  - Diagnosis of anxiety and/or depression within last 3 years.-    - History of depression due to pregnancy loss/loss of child or -MOB's symptoms are currently being treated with medication and/or therapy.  Please contact the Clinical Social Worker if needs arise or upon MOB request.    Alexander Aument LCSW, MSW Clinical Social Work: System Wide Float Coverage for [facility] NICU Clinical social worker [phone]    

## 2016-08-14 MED ORDER — IBUPROFEN 600 MG PO TABS: 600.0000 mg | ORAL_TABLET | Freq: Four times a day (QID) | ORAL | 0 refills | Status: DC

## 2016-08-14 NOTE — Discharge Instructions (Signed)

## 2016-08-14 NOTE — Lactation Note (Signed)
This note was copied from a baby's chart. Lactation Consultation Note  Mother recently breastfed infant for 15 min.  Denies questions or concerns. Discussed latch depth.  Mom encouraged to feed baby 8-12 times/24 hours and with feeding cues.  Reviewed engorgement care and monitoring voids/stools.   Patient Name: Girl Glory RosebushFanny Downard ZOXWR'UToday's Date: 08/14/2016 Reason for consult: Follow-up assessment   Maternal Data    Feeding Feeding Type: Breast Fed Length of feed: 15 min  LATCH Score/Interventions                      Lactation Tools Discussed/Used     Consult Status Consult Status: Complete    Hardie PulleyBerkelhammer, Jahnavi Muratore Boschen 08/14/2016, 9:51 AM

## 2016-08-14 NOTE — Discharge Summary (Signed)
Obstetric Discharge Summary  Reason for Admission: rupture of membranes on 08/12/16 Prenatal Procedures: none Intrapartum Procedures: spontaneous vaginal delivery by Nigel BridgemanVicki Latham cnm on 08/12/16 Postpartum Procedures: none Complications-Operative and Postpartum: none  Hemoglobin  Date Value Ref Range Status  08/13/2016 10.4 (L) 12.0 - 15.0 g/dL Final   HCT  Date Value Ref Range Status  08/13/2016 30.5 (L) 36.0 - 46.0 % Final    Discharge Diagnoses: Term Pregnancy-delivered  Physical exam:   General: normal Lochia: appropriate Uterine Fundus: @ U firm non-tender  Extremities: No evidence of DVT seen on physical exam. Edema none   Hospital course: uncomplicated  Date: 08/14/2016 Activity: unrestricted Diet: routine Medications: Ibuprofen Condition: stable  Breastfeeding:   Yes.   Contraception:  *Mirena  Instructions: refer to practice specific booklet Discharge to: home Follow-up Information    [facility] Follow up in 6 week(s).   Specialty:  Obstetrics and Gynecology Contact information: [address] [place] [phone]          Newborn Data:   Baby female Name: Mart PiggsChloe  Oshua Mcconaha A Talibah Colasurdo CNM 08/14/2016, 6:36 AM

## 2017-02-19 ENCOUNTER — Other Ambulatory Visit (INDEPENDENT_AMBULATORY_CARE_PROVIDER_SITE_OTHER): Payer: BLUE CROSS/BLUE SHIELD

## 2017-02-19 ENCOUNTER — Encounter: Payer: Self-pay | Admitting: Nurse Practitioner

## 2017-02-19 ENCOUNTER — Ambulatory Visit (INDEPENDENT_AMBULATORY_CARE_PROVIDER_SITE_OTHER): Payer: BLUE CROSS/BLUE SHIELD | Admitting: Nurse Practitioner

## 2017-02-19 VITALS — BP 108/76 | HR 91 | Temp 97.7°F | Ht 65.0 in | Wt 127.0 lb

## 2017-02-19 DIAGNOSIS — F329 Major depressive disorder, single episode, unspecified: Secondary | ICD-10-CM

## 2017-02-19 DIAGNOSIS — K5900 Constipation, unspecified: Secondary | ICD-10-CM

## 2017-02-19 DIAGNOSIS — Z0000 Encounter for general adult medical examination without abnormal findings: Secondary | ICD-10-CM

## 2017-02-19 DIAGNOSIS — D649 Anemia, unspecified: Secondary | ICD-10-CM

## 2017-02-19 DIAGNOSIS — K5909 Other constipation: Secondary | ICD-10-CM | POA: Insufficient documentation

## 2017-02-19 DIAGNOSIS — F339 Major depressive disorder, recurrent, unspecified: Secondary | ICD-10-CM

## 2017-02-19 DIAGNOSIS — F32A Depression, unspecified: Secondary | ICD-10-CM

## 2017-02-19 LAB — CBC WITH DIFFERENTIAL/PLATELET
BASOS PCT: 0.8 % (ref 0.0–3.0)
Basophils Absolute: 0 10*3/uL (ref 0.0–0.1)
EOS ABS: 0.2 10*3/uL (ref 0.0–0.7)
EOS PCT: 3.6 % (ref 0.0–5.0)
HEMATOCRIT: 39.6 % (ref 36.0–46.0)
HEMOGLOBIN: 13.2 g/dL (ref 12.0–15.0)
LYMPHS PCT: 32.8 % (ref 12.0–46.0)
Lymphs Abs: 2.1 10*3/uL (ref 0.7–4.0)
MCHC: 33.2 g/dL (ref 30.0–36.0)
MCV: 87.7 fl (ref 78.0–100.0)
Monocytes Absolute: 0.4 10*3/uL (ref 0.1–1.0)
Monocytes Relative: 7 % (ref 3.0–12.0)
Neutro Abs: 3.6 10*3/uL (ref 1.4–7.7)
Neutrophils Relative %: 55.8 % (ref 43.0–77.0)
Platelets: 221 10*3/uL (ref 150.0–400.0)
RBC: 4.51 Mil/uL (ref 3.87–5.11)
RDW: 12.6 % (ref 11.5–15.5)
WBC: 6.4 10*3/uL (ref 4.0–10.5)

## 2017-02-19 LAB — LIPID PANEL
CHOLESTEROL: 153 mg/dL (ref 0–200)
HDL: 45.3 mg/dL (ref >39.00)
LDL CALC: 99 mg/dL (ref 0–99)
NonHDL: 108.06
TRIGLYCERIDES: 43 mg/dL (ref 0.0–149.0)
Total CHOL/HDL Ratio: 3
VLDL: 8.6 mg/dL (ref 0.0–40.0)

## 2017-02-19 LAB — COMPREHENSIVE METABOLIC PANEL
ALBUMIN: 4.2 g/dL (ref 3.5–5.2)
ALK PHOS: 64 U/L (ref 39–117)
ALT: 9 U/L (ref 0–35)
AST: 14 U/L (ref 0–37)
BUN: 12 mg/dL (ref 6–23)
CALCIUM: 9.2 mg/dL (ref 8.4–10.5)
CHLORIDE: 104 meq/L (ref 96–112)
CO2: 28 mEq/L (ref 19–32)
CREATININE: 0.77 mg/dL (ref 0.40–1.20)
GFR: 96.45 mL/min (ref >60.00)
Glucose, Bld: 82 mg/dL (ref 70–99)
Potassium: 3.8 mEq/L (ref 3.5–5.1)
Sodium: 139 mEq/L (ref 135–145)
Total Bilirubin: 0.5 mg/dL (ref 0.2–1.2)
Total Protein: 7.1 g/dL (ref 6.0–8.3)

## 2017-02-19 LAB — T4, FREE: Free T4: 0.94 ng/dL (ref 0.60–1.60)

## 2017-02-19 LAB — IBC PANEL
IRON: 103 ug/dL (ref 42–145)
SATURATION RATIOS: 25.5 % (ref 20.0–50.0)
TRANSFERRIN: 289 mg/dL (ref 212.0–360.0)

## 2017-02-19 LAB — TSH: TSH: 1.09 u[IU]/mL (ref 0.35–4.50)

## 2017-02-19 LAB — FERRITIN: Ferritin: 20.8 ng/mL (ref 10.0–291.0)

## 2017-02-19 MED ORDER — DOCUSATE SODIUM 100 MG PO CAPS: 100.0000 mg | ORAL_CAPSULE | Freq: Two times a day (BID) | ORAL | 0 refills | Status: DC | PRN

## 2017-02-19 MED ORDER — FLUOXETINE HCL 20 MG PO TABS: 20.0000 mg | ORAL_TABLET | Freq: Every day | ORAL | 5 refills | Status: DC

## 2017-02-19 NOTE — Patient Instructions (Signed)
I instructed pt to start 1/2 tablet once daily for 1 week and then increase to a full tablet once daily on week two as tolerated.   We discussed common side effects such as nausea, drowsiness and weight gain.  Also discussed rare but serious side effect of suicide ideation.  She is instructed to discontinue medication go directly to ED if this occurs.  Pt verbalizes understanding.   Plan follow up in 2weeks to evaluate progress.       

## 2017-02-19 NOTE — Progress Notes (Signed)
Pre visit review using our clinic review tool, if applicable. No additional management support is needed unless otherwise documented below in the visit note. 

## 2017-02-19 NOTE — Progress Notes (Signed)
Subjective:    Patient ID: Linda Huber, female    DOB: 29-Jan-1991, 26 y.o.   MRN: [id number]  Patient presents today for establish care (new patient).  Depression       The patient presents with depression.  This is a recurrent problem.  The current episode started more than 1 year ago.   The onset quality is gradual.   The problem occurs intermittently.  The problem has been waxing and waning since onset.  Associated symptoms include fatigue, irritable, decreased interest and sad.  Associated symptoms include no decreased concentration, no helplessness, no hopelessness, does not have insomnia, no restlessness, no appetite change, no body aches, no myalgias, no headaches and no suicidal ideas.     Exacerbated by: recent childbirth.  Past treatments include SSRIs - Selective serotonin reuptake inhibitors.  Compliance with treatment is variable.  Previous treatment provided mild relief.  Past medical history includes anxiety and depression.     Pertinent negatives include no chronic illness, no recent illness, no life-threatening condition and no suicide attempts. Constipation  This is a chronic problem. The current episode started more than 1 month ago. The problem is unchanged. The stool is described as firm and pellet like. The patient is on a high fiber diet. She does not exercise regularly. There has been adequate water intake. Pertinent negatives include no abdominal pain, anorexia, back pain, bloating, diarrhea, difficulty urinating, fecal incontinence, fever, flatus, hematochezia, hemorrhoids, melena, nausea, rectal pain, vomiting or weight loss. Risk factors include stress. She has tried diet changes for the symptoms. The treatment provided no relief. Her past medical history is significant for psychiatric history.    Immunizations: (TDAP, Hep C screen, Pneumovax, Influenza, zoster)  Health Maintenance  Topic Date Due  . Pap Smear  02/01/2016  . Flu Shot  07/14/2016  . Tetanus Vaccine   06/17/2025  . HIV Screening  Completed   Diet:regular Weight:  Wt Readings from Last 3 Encounters:  02/19/17 127 lb (57.6 kg)  08/12/16 153 lb (69.4 kg)  08/05/16 153 lb (69.4 kg)   Exercise:none Fall Risk: Fall Risk  02/19/2017 02/19/2017  Falls in the past year? No No   Home Safety: Depression/Suicide: Depression screen PHQ 2/9 02/19/2017  Down, Depressed, Hopeless 3  PHQ - 2 Score 3  Altered sleeping 0  Tired, decreased energy 3  Change in appetite 0  Feeling bad or failure about yourself  0  Trouble concentrating 3  Moving slowly or fidgety/restless 0  Suicidal thoughts 0  PHQ-9 Score 9   No flowsheet data found. Pap Smear (every 50yrs for >21-29 without HPV, every 77yrs for >30-89yrs with HPV):married, sexually active.  Advanced Directive: Advanced Directives 08/12/2016  Does Patient Have a Medical Advance Directive? No  Would patient like information on creating a medical advance directive? No - patient declined information  Pre-existing out of facility DNR order (yellow form or pink MOST form) -   Sexual History (birth control, marital status, STD):IUD in place, sexually active  Medications and allergies reviewed with patient and updated if appropriate.  Patient Active Problem List   Diagnosis Date Noted  . Anemia 02/19/2017  . Constipation 02/19/2017  . Sickle cell trait (HCC) 11/09/2014  . Depression, recurrent (HCC) 11/09/2014    Current Outpatient Prescriptions on File Prior to Visit  Medication Sig Dispense Refill  . Prenatal Vit-Fe Fumarate-FA (PRENATAL MULTIVITAMIN) TABS tablet Take 1 tablet by mouth daily at 12 noon.    Marland Kitchen acetaminophen (TYLENOL) 500 MG tablet  Take 1,000 mg by mouth every 6 (six) hours as needed for moderate pain.     No current facility-administered medications on file prior to visit.     Past Medical History:  Diagnosis Date  . Depression    not on meds, doing good  . Infection    UTI  . Urinary tract infection     Past  Surgical History:  Procedure Laterality Date  . BIOPSY BREAST    . WISDOM TOOTH EXTRACTION      Social History   Social History  . Marital status: Married    Spouse name: N/A  . Number of children: N/A  . Years of education: N/A   Social History Main Topics  . Smoking status: Never Smoker  . Smokeless tobacco: Never Used  . Alcohol use Yes     Comment: not with + preg  . Drug use: No  . Sexual activity: Yes    Birth control/ protection: None   Other Topics Concern  . None   Social History Narrative  . None    Family History  Problem Relation Age of Onset  . Leukemia Paternal Grandmother   . Sickle cell anemia Paternal Grandmother   . Stroke Maternal Grandfather   . Hyperlipidemia Father   . Asthma Mother   . Hypertension Mother   . Cancer Neg Hx   . Heart disease Neg Hx         Review of Systems  Constitutional: Positive for fatigue. Negative for appetite change, fever, malaise/fatigue and weight loss.  HENT: Negative for congestion and sore throat.   Eyes:       Negative for visual changes  Respiratory: Negative for cough and shortness of breath.   Cardiovascular: Negative for chest pain, palpitations and leg swelling.  Gastrointestinal: Negative for abdominal pain, anorexia, bloating, blood in stool, constipation, diarrhea, flatus, heartburn, hematochezia, hemorrhoids, melena, nausea, rectal pain and vomiting.  Genitourinary: Negative for difficulty urinating, dysuria, frequency and urgency.  Musculoskeletal: Negative for back pain, falls, joint pain and myalgias.  Skin: Negative for rash.  Neurological: Negative for dizziness, sensory change and headaches.  Endo/Heme/Allergies: Positive for environmental allergies. Does not bruise/bleed easily.  Psychiatric/Behavioral: Positive for depression. Negative for decreased concentration, substance abuse and suicidal ideas. The patient is nervous/anxious. The patient does not have insomnia.     Objective:    Vitals:   02/19/17 0929  BP: 108/76  Pulse: 91  Temp: 97.7 F (36.5 C)    Body mass index is 21.13 kg/m.   Physical Examination:  Physical Exam  Constitutional: She is oriented to person, place, and time and well-developed, well-nourished, and in no distress. She is irritable. No distress.  HENT:  Right Ear: External ear normal.  Left Ear: External ear normal.  Nose: Nose normal.  Mouth/Throat: Oropharynx is clear and moist. No oropharyngeal exudate.  Eyes: Conjunctivae and EOM are normal. Pupils are equal, round, and reactive to light. No scleral icterus.  Neck: Normal range of motion. Neck supple. No thyromegaly present.  Cardiovascular: Normal rate, normal heart sounds and intact distal pulses.   Pulmonary/Chest: Effort normal and breath sounds normal. She exhibits no tenderness.  Abdominal: Soft. Bowel sounds are normal. She exhibits no distension. There is no tenderness.  Musculoskeletal: Normal range of motion. She exhibits no edema or tenderness.  Lymphadenopathy:    She has no cervical adenopathy.  Neurological: She is alert and oriented to person, place, and time. Gait normal.  Skin: Skin is warm and dry.  Psychiatric: Affect and judgment normal.    ASSESSMENT and PLAN:  Nikole was seen today for establish care.  Diagnoses and all orders for this visit:  Encounter for medical examination to establish care -     TSH; Future -     T4, free; Future -     CBC w/Diff; Future -     Comprehensive metabolic panel; Future -     Lipid panel; Future  Constipation, unspecified constipation type -     TSH; Future -     T4, free; Future -     docusate sodium (COLACE) 100 MG capsule; Take 1 capsule (100 mg total) by mouth 2 (two) times daily as needed for mild constipation.  Anemia, unspecified type -     CBC w/Diff; Future -     Ferritin; Future -     IBC panel; Future  Depression, recurrent (HCC) -     TSH; Future -     T4, free; Future -     FLUoxetine  (PROZAC) 20 MG tablet; Take 1 tablet (20 mg total) by mouth daily.   Recent Results (from the past 2160 hour(s))  TSH     Status: None   Collection Time: 02/19/17 10:27 AM  Result Value Ref Range   TSH 1.09 0.35 - 4.50 uIU/mL  T4, free     Status: None   Collection Time: 02/19/17 10:27 AM  Result Value Ref Range   Free T4 0.94 0.60 - 1.60 ng/dL    Comment: Specimens from patients who are undergoing biotin therapy and /or ingesting biotin supplements may contain high levels of biotin.  The higher biotin concentration in these specimens interferes with this Free T4 assay.  Specimens that contain high levels  of biotin may cause false high results for this Free T4 assay.  Please interpret results in light of the total clinical presentation of the patient.    CBC w/Diff     Status: None   Collection Time: 02/19/17 10:27 AM  Result Value Ref Range   WBC 6.4 4.0 - 10.5 K/uL   RBC 4.51 3.87 - 5.11 Mil/uL   Hemoglobin 13.2 12.0 - 15.0 g/dL   HCT 16.1 09.6 - 04.5 %   MCV 87.7 78.0 - 100.0 fl   MCHC 33.2 30.0 - 36.0 g/dL   RDW 40.9 81.1 - 91.4 %   Platelets 221.0 150.0 - 400.0 K/uL   Neutrophils Relative % 55.8 43.0 - 77.0 %   Lymphocytes Relative 32.8 12.0 - 46.0 %   Monocytes Relative 7.0 3.0 - 12.0 %   Eosinophils Relative 3.6 0.0 - 5.0 %   Basophils Relative 0.8 0.0 - 3.0 %   Neutro Abs 3.6 1.4 - 7.7 K/uL   Lymphs Abs 2.1 0.7 - 4.0 K/uL   Monocytes Absolute 0.4 0.1 - 1.0 K/uL   Eosinophils Absolute 0.2 0.0 - 0.7 K/uL   Basophils Absolute 0.0 0.0 - 0.1 K/uL  Comprehensive metabolic panel     Status: None   Collection Time: 02/19/17 10:27 AM  Result Value Ref Range   Sodium 139 135 - 145 mEq/L   Potassium 3.8 3.5 - 5.1 mEq/L   Chloride 104 96 - 112 mEq/L   CO2 28 19 - 32 mEq/L   Glucose, Bld 82 70 - 99 mg/dL   BUN 12 6 - 23 mg/dL   Creatinine, Ser 7.82 0.40 - 1.20 mg/dL   Total Bilirubin 0.5 0.2 - 1.2 mg/dL   Alkaline Phosphatase 64 39 -  117 U/L   AST 14 0 - 37 U/L   ALT 9 0  - 35 U/L   Total Protein 7.1 6.0 - 8.3 g/dL   Albumin 4.2 3.5 - 5.2 g/dL   Calcium 9.2 8.4 - 16.1 mg/dL   GFR 09.60 >45.40 mL/min  Lipid panel     Status: None   Collection Time: 02/19/17 10:27 AM  Result Value Ref Range   Cholesterol 153 0 - 200 mg/dL    Comment: ATP III Classification       Desirable:  < 200 mg/dL               Borderline High:  200 - 239 mg/dL          High:  > = 981 mg/dL   Triglycerides 19.1 0.0 - 149.0 mg/dL    Comment: Normal:  <478 mg/dLBorderline High:  150 - 199 mg/dL   HDL 29.56 >21.30 mg/dL   VLDL 8.6 0.0 - 86.5 mg/dL   LDL Cholesterol 99 0 - 99 mg/dL   Total CHOL/HDL Ratio 3     Comment:                Men          Women1/2 Average Risk     3.4          3.3Average Risk          5.0          4.42X Average Risk          9.6          7.13X Average Risk          15.0          11.0                       NonHDL 108.06     Comment: NOTE:  Non-HDL goal should be 30 mg/dL higher than patient's LDL goal (i.e. LDL goal of < 70 mg/dL, would have non-HDL goal of < 100 mg/dL)  Ferritin     Status: None   Collection Time: 02/19/17 10:27 AM  Result Value Ref Range   Ferritin 20.8 10.0 - 291.0 ng/mL  IBC panel     Status: None   Collection Time: 02/19/17 10:27 AM  Result Value Ref Range   Iron 103 42 - 145 ug/dL   Transferrin 784.6 962.9 - 360.0 mg/dL   Saturation Ratios 52.8 20.0 - 50.0 %    No problem-specific Assessment & Plan notes found for this encounter.     Follow up: Return in about 2 weeks (around 03/05/2017) for depression and constipation.  Alysia Penna, NP

## 2017-02-19 NOTE — Progress Notes (Signed)
Normal results

## 2017-03-05 ENCOUNTER — Ambulatory Visit: Payer: BLUE CROSS/BLUE SHIELD | Admitting: Nurse Practitioner

## 2017-03-05 DIAGNOSIS — Z0289 Encounter for other administrative examinations: Secondary | ICD-10-CM

## 2017-03-08 ENCOUNTER — Ambulatory Visit: Payer: BLUE CROSS/BLUE SHIELD | Admitting: Nurse Practitioner

## 2017-04-05 ENCOUNTER — Ambulatory Visit (HOSPITAL_COMMUNITY)
Admission: EM | Admit: 2017-04-05 | Discharge: 2017-04-05 | Disposition: A | Discharge status: Home / Self Care | Payer: BLUE CROSS/BLUE SHIELD | Attending: Family Medicine | Admitting: Family Medicine

## 2017-04-05 ENCOUNTER — Ambulatory Visit (INDEPENDENT_AMBULATORY_CARE_PROVIDER_SITE_OTHER): Payer: BLUE CROSS/BLUE SHIELD

## 2017-04-05 ENCOUNTER — Encounter (HOSPITAL_COMMUNITY): Payer: Self-pay | Admitting: Emergency Medicine

## 2017-04-05 DIAGNOSIS — M25531 Pain in right wrist: Secondary | ICD-10-CM

## 2017-04-05 MED ORDER — ACETAMINOPHEN 325 MG PO TABS
ORAL_TABLET | ORAL | Status: AC
  Filled 2017-04-05: qty 3

## 2017-04-05 MED ORDER — ACETAMINOPHEN 325 MG PO TABS
975.0000 mg | ORAL_TABLET | Freq: Once | ORAL | Status: AC
  Administered 2017-04-05: 975 mg via ORAL

## 2017-04-05 NOTE — Discharge Instructions (Signed)
There were no acute findings on X-ray, however due to there being point tenderness at the base of the thumb, this can be a sign of a scaphoid fracture. These type of fractures are notoriously difficult to see on X-ray. We have splinted your hand and I recommend you follow up with a hand specialist in one week for reevaluation.

## 2017-04-05 NOTE — ED Provider Notes (Signed)
CSN: [id number]     Arrival date & time 04/05/17  1117 History   First MD Initiated Contact with Patient 04/05/17 1213     Chief Complaint  Patient presents with  . Wrist Injury    right   (Consider location/radiation/quality/duration/timing/severity/associated sxs/prior Treatment) 26 year old female presents to clinic for evaluation of right wrist pain following a fall that occurred yesterday at home. She is on the second stair and fell forward on outstretched hand, she felt a "pop" in her hand and has had swelling, and pain with movement, along with bruising since. She has taken over-the-counter ibuprofen with some relief, however pain is worsening. She is right handed, she has no other symptoms   The history is provided by the patient.  Wrist Pain  This is a new problem. The current episode started yesterday. The problem has been gradually worsening. Pertinent negatives include no chest pain, no abdominal pain, no headaches and no shortness of breath. The symptoms are aggravated by bending. The symptoms are relieved by NSAIDs, acetaminophen and position. The treatment provided mild relief.    Past Medical History:  Diagnosis Date  . Depression    not on meds, doing good  . Infection    UTI  . Urinary tract infection    Past Surgical History:  Procedure Laterality Date  . BIOPSY BREAST    . WISDOM TOOTH EXTRACTION     Family History  Problem Relation Age of Onset  . Leukemia Paternal Grandmother   . Sickle cell anemia Paternal Grandmother   . Stroke Maternal Grandfather   . Hyperlipidemia Father   . Asthma Mother   . Hypertension Mother   . Cancer Neg Hx   . Heart disease Neg Hx    Social History  Substance Use Topics  . Smoking status: Never Smoker  . Smokeless tobacco: Never Used  . Alcohol use Yes     Comment: not with + preg   OB History    Gravida Para Term Preterm AB Living   SAB TAB Ectopic Multiple Live Births   1     0 3     Review of  Systems  Constitutional: Negative.   HENT: Negative.   Respiratory: Negative for cough and shortness of breath.   Cardiovascular: Negative for chest pain and palpitations.  Gastrointestinal: Negative for abdominal pain, nausea and vomiting.  Genitourinary: Negative.   Musculoskeletal: Positive for joint swelling. Negative for neck pain and neck stiffness.  Skin: Negative.   Neurological: Negative for dizziness and headaches.  All other systems reviewed and are negative.   Allergies  Patient has no known allergies.  Home Medications   Prior to Admission medications   Medication Sig Start Date End Date Taking? Authorizing Provider  acetaminophen (TYLENOL) 500 MG tablet Take 1,000 mg by mouth every 6 (six) hours as needed for moderate pain.    Historical Provider, MD  docusate sodium (COLACE) 100 MG capsule Take 1 capsule (100 mg total) by mouth 2 (two) times daily as needed for mild constipation. 02/19/17   Anne Ng, NP  FLUoxetine (PROZAC) 20 MG tablet Take 1 tablet (20 mg total) by mouth daily. 02/19/17   Anne Ng, NP  Prenatal Vit-Fe Fumarate-FA (PRENATAL MULTIVITAMIN) TABS tablet Take 1 tablet by mouth daily at 12 noon.    Historical Provider, MD   Meds Ordered and Administered this Visit   Medications  acetaminophen (TYLENOL) tablet 975 mg (975 mg Oral  Given 04/05/17 1234)    BP 115/78 (BP Location: Right Arm)   Pulse 67   Temp 98.4 F (36.9 C) (Oral)   SpO2 96%  No data found.   Physical Exam  Constitutional: She is oriented to person, place, and time. She appears well-developed and well-nourished. No distress.  HENT:  Head: Normocephalic and atraumatic.  Right Ear: External ear normal.  Left Ear: External ear normal.  Musculoskeletal:       Right hand: She exhibits decreased range of motion, tenderness and swelling. Normal sensation noted.       Hands: Point tenderness at the base of the thumb in the anatomical snuffbox  Neurological: She is alert  and oriented to person, place, and time.  Skin: Skin is warm and dry. Capillary refill takes less than 2 seconds. No rash noted. She is not diaphoretic. No erythema.  Psychiatric: She has a normal mood and affect. Her behavior is normal.  Nursing note and vitals reviewed.   Urgent Care Course     Procedures (including critical care time)  Labs Review Labs Reviewed - No data to display  Imaging Review Dg Wrist Complete Right  Result Date: 04/05/2017 CLINICAL DATA:  Fall.  Injury. EXAM: RIGHT WRIST - COMPLETE 3+ VIEW COMPARISON:  No recent prior. FINDINGS: No acute bony or joint abnormality identified. No evidence of fracture or dislocation. IMPRESSION: No acute abnormality. Electronically Signed   By: Maisie Fus  Register   On: 04/05/2017 12:32       MDM   1. Right wrist pain     Due to point tenderness at the anatomical snuffbox, hand was placed in a thumb spica, and patient instructed to follow up with hand specialist in one week. May take over-the-counter ibuprofen or Tylenol as needed for pain. Counseling provided on the significance of this injury, and importance of follow-up.     Dorena Bodo, NP 04/05/17 1309

## 2017-04-05 NOTE — ED Triage Notes (Signed)
Pt states she fell on her stairs yesterday and twisted her right wrist.  She states she heard a pop when it occurred and now she still has pain and swelling.

## 2017-04-27 ENCOUNTER — Ambulatory Visit: Payer: BLUE CROSS/BLUE SHIELD | Admitting: Nurse Practitioner

## 2017-04-28 ENCOUNTER — Ambulatory Visit (INDEPENDENT_AMBULATORY_CARE_PROVIDER_SITE_OTHER): Payer: BLUE CROSS/BLUE SHIELD | Admitting: Nurse Practitioner

## 2017-04-28 ENCOUNTER — Encounter: Payer: Self-pay | Admitting: Gastroenterology

## 2017-04-28 ENCOUNTER — Encounter: Payer: Self-pay | Admitting: Nurse Practitioner

## 2017-04-28 VITALS — BP 100/82 | HR 103 | Temp 97.8°F | Ht 65.0 in | Wt 125.0 lb

## 2017-04-28 DIAGNOSIS — F339 Major depressive disorder, recurrent, unspecified: Secondary | ICD-10-CM

## 2017-04-28 DIAGNOSIS — J069 Acute upper respiratory infection, unspecified: Secondary | ICD-10-CM

## 2017-04-28 DIAGNOSIS — K921 Melena: Secondary | ICD-10-CM

## 2017-04-28 DIAGNOSIS — K5909 Other constipation: Secondary | ICD-10-CM

## 2017-04-28 DIAGNOSIS — K649 Unspecified hemorrhoids: Secondary | ICD-10-CM

## 2017-04-28 MED ORDER — FLUTICASONE PROPIONATE 50 MCG/ACT NA SUSP: 2.0000 | Freq: Every day | NASAL | 0 refills | Status: DC

## 2017-04-28 MED ORDER — AMOXICILLIN 875 MG PO TABS: 875.0000 mg | ORAL_TABLET | Freq: Two times a day (BID) | ORAL | 0 refills | Status: DC

## 2017-04-28 MED ORDER — GUAIFENESIN ER 600 MG PO TB12: 600.0000 mg | ORAL_TABLET | Freq: Two times a day (BID) | ORAL | 0 refills | Status: DC | PRN

## 2017-04-28 MED ORDER — SALINE SPRAY 0.65 % NA SOLN: 1.0000 | NASAL | 0 refills | Status: DC | PRN

## 2017-04-28 MED ORDER — POLYETHYLENE GLYCOL 3350 17 GM/SCOOP PO POWD: 17.0000 g | Freq: Every day | ORAL | 1 refills | Status: DC

## 2017-04-28 MED ORDER — ALBUTEROL SULFATE HFA 108 (90 BASE) MCG/ACT IN AERS: 2.0000 | INHALATION_SPRAY | Freq: Four times a day (QID) | RESPIRATORY_TRACT | 2 refills | Status: DC | PRN

## 2017-04-28 MED ORDER — HYDROCORTISONE ACETATE 25 MG RE SUPP: 25.0000 mg | Freq: Two times a day (BID) | RECTAL | 1 refills | Status: DC

## 2017-04-28 MED ORDER — FLUOXETINE HCL 20 MG PO TABS: 20.0000 mg | ORAL_TABLET | Freq: Every day | ORAL | 1 refills | Status: DC

## 2017-04-28 NOTE — Progress Notes (Signed)
Subjective:  Patient ID: Linda Huber, female    DOB: January 11, 1991  Age: 26 y.o. MRN: [id number]  CC: Follow-up (2 wk fu/blood in stool--1 mo/allergy--cold?)   URI   This is a new problem. The current episode started in the past 7 days. The problem has been unchanged. There has been no fever. Associated symptoms include congestion, coughing, headaches, a plugged ear sensation, rhinorrhea, sinus pain, a sore throat and swollen glands. Treatments tried: mucinex. The treatment provided no relief.   Depression: Improved mood with prozac. Denies any adverse side effects. No SI or HI.  Constipation: Persistent. chronic Ongoing for several years. No improvement with colace, adequate oral hydration and diet changes. Some improvement with miralax. Has hemorrhoids with some blood around stool and when wipes. Current breastfeeding, so no use of amitiza or linzess. No FH of colon polylps or cancer.  Outpatient Medications Prior to Visit  Medication Sig Dispense Refill  . acetaminophen (TYLENOL) 500 MG tablet Take 1,000 mg by mouth every 6 (six) hours as needed for moderate pain.    . Prenatal Vit-Fe Fumarate-FA (PRENATAL MULTIVITAMIN) TABS tablet Take 1 tablet by mouth daily at 12 noon.    . docusate sodium (COLACE) 100 MG capsule Take 1 capsule (100 mg total) by mouth 2 (two) times daily as needed for mild constipation. 10 capsule 0  . FLUoxetine (PROZAC) 20 MG tablet Take 1 tablet (20 mg total) by mouth daily. 30 tablet 5   No facility-administered medications prior to visit.     ROS See HPI  Objective:  BP 100/82   Pulse (!) 103   Temp 97.8 F (36.6 C)   Ht 5\' 5"  (1.651 m)   Wt 125 lb (56.7 kg)   SpO2 96%   BMI 20.80 kg/m   BP Readings from Last 3 Encounters:  04/28/17 100/82  04/05/17 115/78  02/19/17 108/76    Wt Readings from Last 3 Encounters:  04/28/17 125 lb (56.7 kg)  02/19/17 127 lb (57.6 kg)  08/12/16 153 lb (69.4 kg)    Physical Exam  Constitutional: She  is oriented to person, place, and time.  HENT:  Right Ear: Tympanic membrane, external ear and ear canal normal.  Left Ear: Tympanic membrane, external ear and ear canal normal.  Nose: Mucosal edema and rhinorrhea present. Right sinus exhibits maxillary sinus tenderness. Right sinus exhibits no frontal sinus tenderness. Left sinus exhibits maxillary sinus tenderness. Left sinus exhibits no frontal sinus tenderness.  Mouth/Throat: Uvula is midline. No trismus in the jaw. Posterior oropharyngeal erythema present. No oropharyngeal exudate.  Eyes: No scleral icterus.  Neck: Normal range of motion. Neck supple.  Cardiovascular: Normal rate and normal heart sounds.   Pulmonary/Chest: Effort normal and breath sounds normal. She has no wheezes. She has no rales.  Musculoskeletal: She exhibits no edema.  Lymphadenopathy:    She has cervical adenopathy.  Neurological: She is alert and oriented to person, place, and time.  Vitals reviewed.   Lab Results  Component Value Date   WBC 6.4 02/19/2017   HGB 13.2 02/19/2017   HCT 39.6 02/19/2017   PLT 221.0 02/19/2017   GLUCOSE 82 02/19/2017   CHOL 153 02/19/2017   TRIG 43.0 02/19/2017   HDL 45.30 02/19/2017   LDLCALC 99 02/19/2017   ALT 9 02/19/2017   AST 14 02/19/2017   NA 139 02/19/2017   K 3.8 02/19/2017   CL 104 02/19/2017   CREATININE 0.77 02/19/2017   BUN 12 02/19/2017   CO2 28 02/19/2017  TSH 1.09 02/19/2017    Dg Wrist Complete Right  Result Date: 04/05/2017 CLINICAL DATA:  Fall.  Injury. EXAM: RIGHT WRIST - COMPLETE 3+ VIEW COMPARISON:  No recent prior. FINDINGS: No acute bony or joint abnormality identified. No evidence of fracture or dislocation. IMPRESSION: No acute abnormality. Electronically Signed   By: Maisie Fus  Register   On: 04/05/2017 12:32    Assessment & Plan:   Linda Huber was seen today for follow-up.  Diagnoses and all orders for this visit:  Depression, recurrent (HCC) -     FLUoxetine (PROZAC) 20 MG tablet; Take 1  tablet (20 mg total) by mouth daily.  Chronic constipation -     polyethylene glycol powder (GLYCOLAX/MIRALAX) powder; Take 17 g by mouth daily. -     Ambulatory referral to Gastroenterology  Hemorrhoids, unspecified hemorrhoid type -     hydrocortisone (ANUSOL-HC) 25 MG suppository; Place 1 suppository (25 mg total) rectally 2 (two) times daily.  Blood in the stool -     Ambulatory referral to Gastroenterology  Upper respiratory tract infection, unspecified type -     fluticasone (FLONASE) 50 MCG/ACT nasal spray; Place 2 sprays into both nostrils daily. -     sodium chloride (OCEAN) 0.65 % SOLN nasal spray; Place 1 spray into both nostrils as needed for congestion. -     guaiFENesin (MUCINEX) 600 MG 12 hr tablet; Take 1 tablet (600 mg total) by mouth 2 (two) times daily as needed for cough or to loosen phlegm. -     amoxicillin (AMOXIL) 875 MG tablet; Take 1 tablet (875 mg total) by mouth 2 (two) times daily. -     albuterol (PROVENTIL HFA;VENTOLIN HFA) 108 (90 Base) MCG/ACT inhaler; Inhale 2 puffs into the lungs every 6 (six) hours as needed for wheezing or shortness of breath.   I have discontinued Linda Huber's docusate sodium. I am also having her start on polyethylene glycol powder, hydrocortisone, fluticasone, sodium chloride, guaiFENesin, amoxicillin, and albuterol. Additionally, I am having her maintain her prenatal multivitamin, acetaminophen, and FLUoxetine.  Meds ordered this encounter  Medications  . polyethylene glycol powder (GLYCOLAX/MIRALAX) powder    Sig: Take 17 g by mouth daily.    Dispense:  3350 g    Refill:  1    Order Specific Question:   Supervising Provider    Answer:   Tresa Garter [[id number]]  . hydrocortisone (ANUSOL-HC) 25 MG suppository    Sig: Place 1 suppository (25 mg total) rectally 2 (two) times daily.    Dispense:  12 suppository    Refill:  1    Order Specific Question:   Supervising Provider    Answer:   Tresa Garter [[id number]]  .  fluticasone (FLONASE) 50 MCG/ACT nasal spray    Sig: Place 2 sprays into both nostrils daily.    Dispense:  16 g    Refill:  0    Order Specific Question:   Supervising Provider    Answer:   Tresa Garter [[id number]]  . sodium chloride (OCEAN) 0.65 % SOLN nasal spray    Sig: Place 1 spray into both nostrils as needed for congestion.    Dispense:  15 mL    Refill:  0    Order Specific Question:   Supervising Provider    Answer:   Tresa Garter [[id number]]  . guaiFENesin (MUCINEX) 600 MG 12 hr tablet    Sig: Take 1 tablet (600 mg total) by mouth 2 (two) times daily as needed for  cough or to loosen phlegm.    Dispense:  14 tablet    Refill:  0    Order Specific Question:   Supervising Provider    Answer:   Tresa GarterPLOTNIKOV, ALEKSEI V [[id number]]  . amoxicillin (AMOXIL) 875 MG tablet    Sig: Take 1 tablet (875 mg total) by mouth 2 (two) times daily.    Dispense:  14 tablet    Refill:  0    Order Specific Question:   Supervising Provider    Answer:   Tresa GarterPLOTNIKOV, ALEKSEI V [[id number]]  . albuterol (PROVENTIL HFA;VENTOLIN HFA) 108 (90 Base) MCG/ACT inhaler    Sig: Inhale 2 puffs into the lungs every 6 (six) hours as needed for wheezing or shortness of breath.    Dispense:  1 Inhaler    Refill:  2    Order Specific Question:   Supervising Provider    Answer:   Tresa GarterPLOTNIKOV, ALEKSEI V [[id number]]  . FLUoxetine (PROZAC) 20 MG tablet    Sig: Take 1 tablet (20 mg total) by mouth daily.    Dispense:  90 tablet    Refill:  1    Order Specific Question:   Supervising Provider    Answer:   Tresa GarterPLOTNIKOV, ALEKSEI V [[id number]]    Follow-up: Return in about 3 months (around 07/29/2017), or if symptoms worsen or fail to improve, for depression and constipation.  Alysia Pennaharlotte Sanjuana Mruk, NP

## 2017-04-28 NOTE — Patient Instructions (Signed)
Continue current dose of prozac.  You will be contacted with appt to GI.   Constipation, Adult Constipation is when a person has fewer bowel movements in a week than normal, has difficulty having a bowel movement, or has stools that are dry, hard, or larger than normal. Constipation may be caused by an underlying condition. It may become worse with age if a person takes certain medicines and does not take in enough fluids. Follow these instructions at home: Eating and drinking    Eat foods that have a lot of fiber, such as fresh fruits and vegetables, whole grains, and beans.  Limit foods that are high in fat, low in fiber, or overly processed, such as french fries, hamburgers, cookies, candies, and soda.  Drink enough fluid to keep your urine clear or pale yellow. General instructions   Exercise regularly or as told by your health care provider.  Go to the restroom when you have the urge to go. Do not hold it in.  Take over-the-counter and prescription medicines only as told by your health care provider. These include any fiber supplements.  Practice pelvic floor retraining exercises, such as deep breathing while relaxing the lower abdomen and pelvic floor relaxation during bowel movements.  Watch your condition for any changes.  Keep all follow-up visits as told by your health care provider. This is important. Contact a health care provider if:  You have pain that gets worse.  You have a fever.  You do not have a bowel movement after 4 days.  You vomit.  You are not hungry.  You lose weight.  You are bleeding from the anus.  You have thin, pencil-like stools. Get help right away if:  You have a fever and your symptoms suddenly get worse.  You leak stool or have blood in your stool.  Your abdomen is bloated.  You have severe pain in your abdomen.  You feel dizzy or you faint. This information is not intended to replace advice given to you by your health care  provider. Make sure you discuss any questions you have with your health care provider. Document Released: 08/28/2004 Document Revised: 06/19/2016 Document Reviewed: 05/20/2016 Elsevier Interactive Patient Education  2017 [employer]

## 2017-04-29 ENCOUNTER — Telehealth: Payer: Self-pay | Admitting: *Deleted

## 2017-04-29 DIAGNOSIS — K649 Unspecified hemorrhoids: Secondary | ICD-10-CM

## 2017-04-29 MED ORDER — HYDROCORTISONE 2.5 % RE CREA: 1.0000 "application " | TOPICAL_CREAM | Freq: Two times a day (BID) | RECTAL | 0 refills | Status: DC

## 2017-04-29 NOTE — Telephone Encounter (Signed)
Rec'd fax stating insurance plan does no cover Hydrocortisone AC 25 mg suppositories. The alternative are the Proctosol HC 2.5% cream. pls advise...Raechel Chute/lmb

## 2017-06-02 ENCOUNTER — Encounter: Payer: Self-pay | Admitting: Gastroenterology

## 2017-06-02 ENCOUNTER — Ambulatory Visit (INDEPENDENT_AMBULATORY_CARE_PROVIDER_SITE_OTHER): Payer: BLUE CROSS/BLUE SHIELD | Admitting: Gastroenterology

## 2017-06-02 VITALS — BP 100/66 | HR 92 | Ht 65.0 in | Wt 125.0 lb

## 2017-06-02 DIAGNOSIS — K625 Hemorrhage of anus and rectum: Secondary | ICD-10-CM | POA: Diagnosis not present

## 2017-06-02 DIAGNOSIS — K5901 Slow transit constipation: Secondary | ICD-10-CM

## 2017-06-02 NOTE — Progress Notes (Signed)
Gastroenterology Consult Note:  History: Linda Huber 06/02/2017  Referring physician: Anne Ng, NP  Reason for consult/chief complaint: Constipation (Patient has movements every 3 days, hard stools, straining and some bleeding)   Subjective  HPI:  This is a 25 year old woman referred by primary care noted above for chronic constipation and intermittent rectal bleeding. She has had occasional constipation for a few years, but it seemed to worsen late in her pregnancy last year. The constipation has continued unabated since a spontaneous vaginal delivery of her second child 9 months ago. She has infrequent stools that are hard with need to strain causing some pressure in the lower abdomen. She has a bowel movement 2 or 3 times a week with difficulty. So far she is taking stool softener and occasional MiraLAX with some improvement. She has occasionally had some blood seen on the stool but not frank rectal bleeding. There is some crampy lower abdominal pain around the time of BMs, no nausea, vomiting, early satiety, dysphagia or weight loss.   ROS:  Review of Systems  Constitutional: Negative for appetite change and unexpected weight change.  HENT: Negative for mouth sores and voice change.   Eyes: Negative for pain and redness.  Respiratory: Negative for cough and shortness of breath.   Cardiovascular: Negative for chest pain and palpitations.  Genitourinary: Negative for dysuria and hematuria.  Musculoskeletal: Negative for arthralgias and myalgias.  Skin: Negative for pallor and rash.  Neurological: Negative for weakness and headaches.  Hematological: Negative for adenopathy.  Psychiatric/Behavioral: Positive for dysphoric mood.   She had postpartum depression which has been somewhat better on fluoxetine.  Past Medical History: Past Medical History:  Diagnosis Date  . Depression    not on meds, doing good  . Infection    UTI  . Urinary tract  infection      Past Surgical History: Past Surgical History:  Procedure Laterality Date  . BIOPSY BREAST    . WISDOM TOOTH EXTRACTION       Family History: Family History  Problem Relation Age of Onset  . Leukemia Paternal Grandmother   . Sickle cell anemia Paternal Grandmother   . Stroke Maternal Grandfather   . Hyperlipidemia Father   . Asthma Mother   . Hypertension Mother   . Cancer Neg Hx   . Heart disease Neg Hx     Social History: Social History   Social History  . Marital status: Married    Spouse name: N/A  . Number of children: 3  . Years of education: N/A   Social History Main Topics  . Smoking status: Never Smoker  . Smokeless tobacco: Never Used  . Alcohol use Yes     Comment: not with + preg  . Drug use: No  . Sexual activity: Yes    Birth control/ protection: None   Other Topics Concern  . None   Social History Narrative  . None    Allergies: No Known Allergies  Outpatient Meds: Current Outpatient Prescriptions  Medication Sig Dispense Refill  . FLUoxetine (PROZAC) 20 MG tablet Take 1 tablet (20 mg total) by mouth daily. 90 tablet 1  . Prenatal Vit-Fe Fumarate-FA (PRENATAL MULTIVITAMIN) TABS tablet Take 1 tablet by mouth daily at 12 noon.     No current facility-administered medications for this visit.       ___________________________________________________________________ Objective   Exam:  BP 100/66   Pulse 92   Ht 5\' 5"  (1.651 m)   Wt 125  lb (56.7 kg)   BMI 20.80 kg/m  Exam chaperoned by our MA Elon Spanneroni Todd  General: this is a(n) well-appearing young woman   Eyes: sclera anicteric, no redness  ENT: oral mucosa moist without lesions, no cervical or supraclavicular lymphadenopathy, good dentition  CV: RRR without murmur, S1/S2, no JVD, no peripheral edema  Resp: clear to auscultation bilaterally, normal RR and effort noted  GI: soft, no tenderness, with active bowel sounds. No guarding or palpable organomegaly  noted.  Skin; warm and dry, no rash or jaundice noted  Neuro: awake, alert and oriented x 3. Normal gross motor function and fluent speech Rectal: Normal exterior, normal sphincter tone with no fissure or tenderness, no palpable internal lesions. There is a small amount of firm stool in the rectal vault. Labs:  TSH nml 02/19/17  No imaging reports  Assessment: Encounter Diagnoses  Name Primary?  . Slow transit constipation Yes  . Rectal bleeding     She seems to have chronic idiopathic constipation, I do not think it is likely to be obstructive in nature. Benign anal bleeding related to constipation, perhaps small internal hemorrhoids resulting from pregnancy. I do not think a colonoscopy is necessary at this point. Plan:  Instructions were given for a colon cleanse followed by once daily use of MiraLAX. See me as needed.  Thank you for the courtesy of this consult.  Please call me with any questions or concerns.  Charlie PitterHenry L Danis III  CC: Nche, Bonna Gainsharlotte Lum, NP

## 2017-06-02 NOTE — Patient Instructions (Addendum)
If you are age 26 or older, your body mass index should be between 23-30. Your Body mass index is 20.8 kg/m. If this is out of the aforementioned range listed, please consider follow up with your Primary Care Provider.  If you are age 26 or younger, your body mass index should be between 19-25. Your Body mass index is 20.8 kg/m. If this is out of the aformentioned range listed, please consider follow up with your Primary Care Provider.   ________________________________________________________________   To relieve constipation:  Two ducolax tablets followed by a half bottle of magnesium citrate.  Do this one evening when you are not planning to go out.  Then start miralax one capful daily.  Can adjust dose as needed.  See me as needed.  Thank you for choosing [facility]  Dr Amada JupiterHenry Danis III

## 2017-12-14 IMAGING — US US OB TRANSVAGINAL
1 series · 15 of 28 positions shown · non-contrast
Comparison: Early obstetric ultrasound 12/19/2015

CLINICAL DATA: Pregnant patient for ultrasound follow-up, dating
and viability.

EXAM:
TRANSVAGINAL OB ULTRASOUND
TECHNIQUE: Transvaginal ultrasound was performed for complete evaluation of the
gestation as well as the maternal uterus, adnexal regions, and
pelvic cul-de-sac.

[Series 1: us ob transvaginal · 15 of 37 slices shown]
[im 1/37]
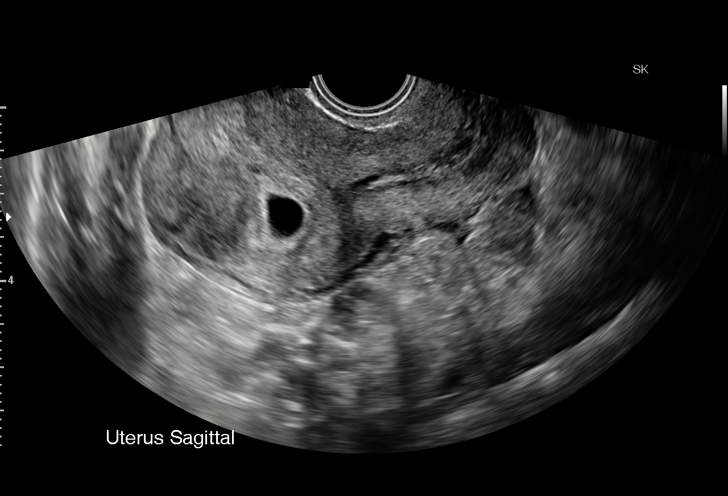
[im 3/37]
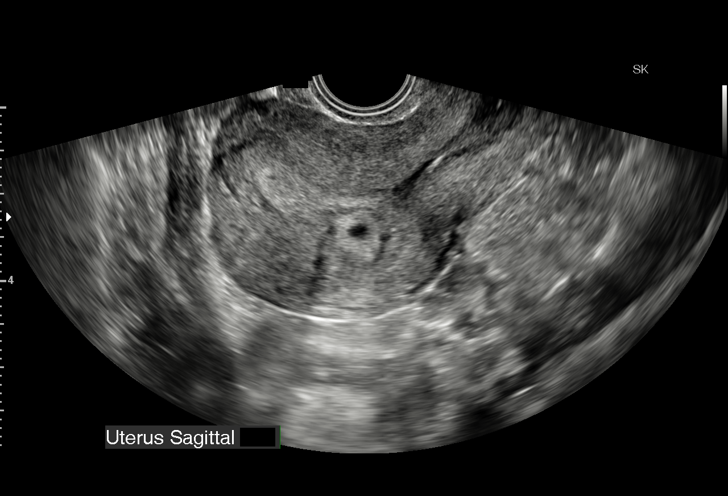
[im 6/37]
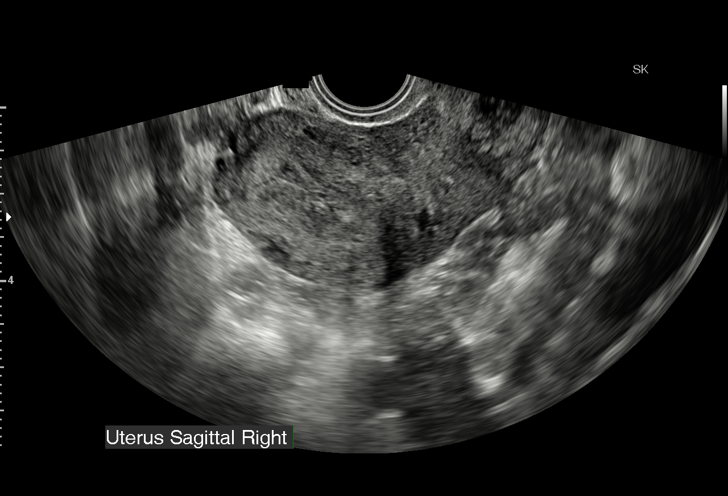
[im 9/37]
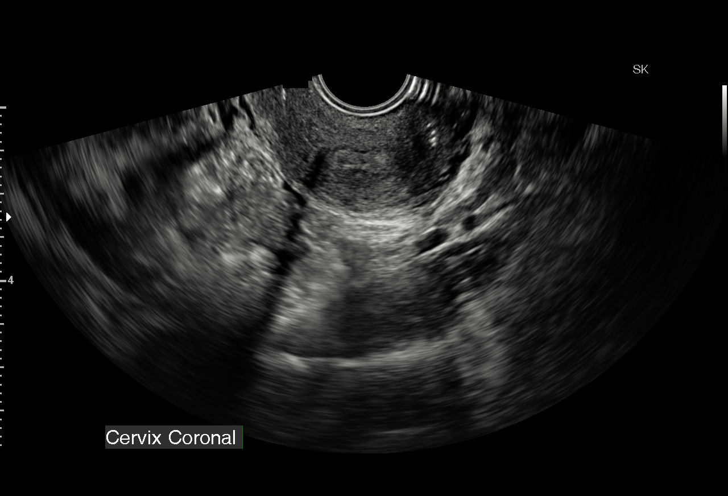
[im 11/37]
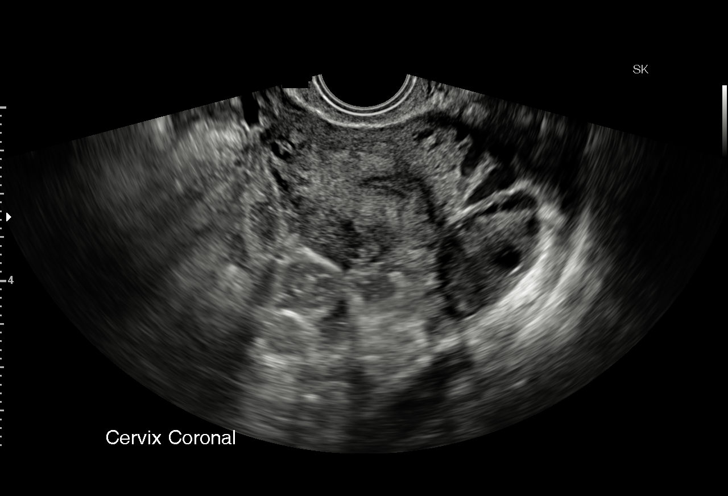
[im 14/37]
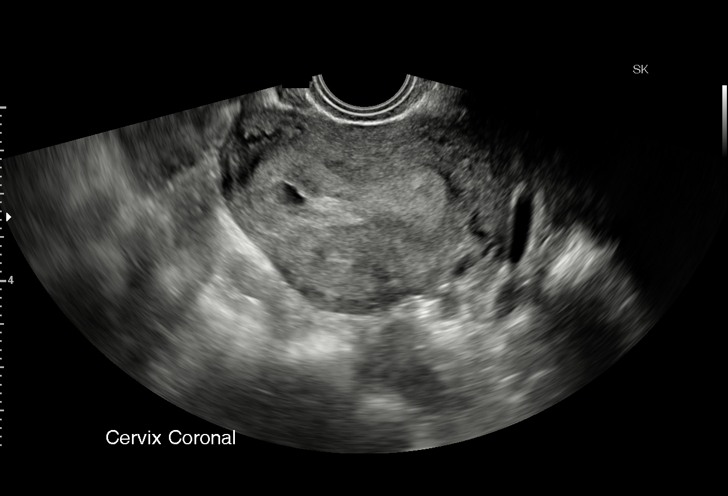
[im 17/37]
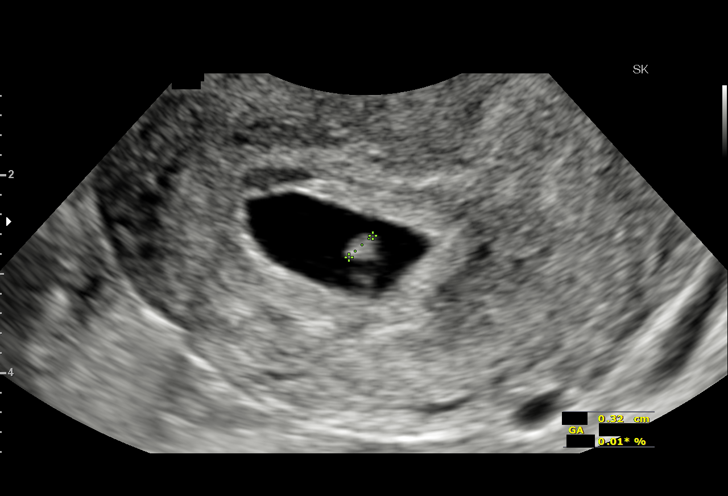
[im 19/37]
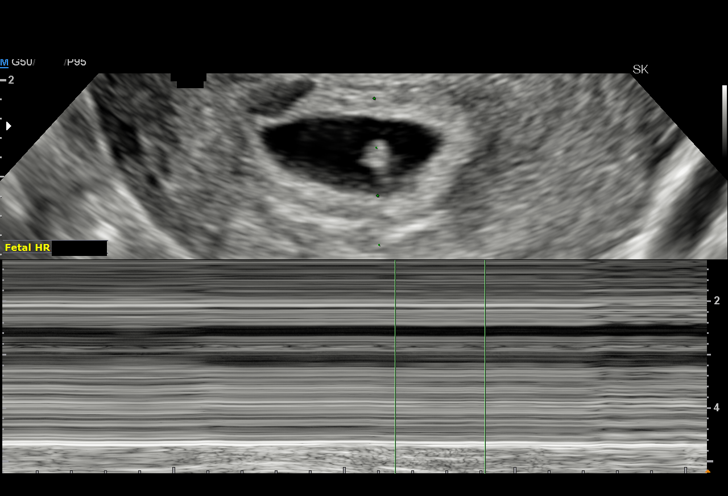
[im 21/37]
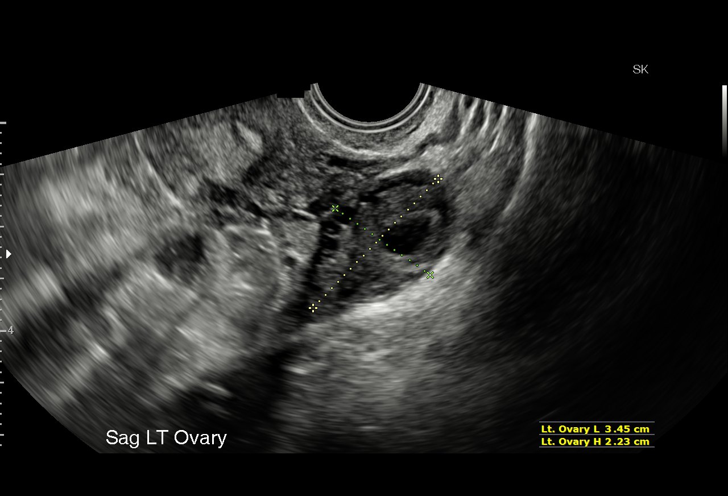
[im 23/37]
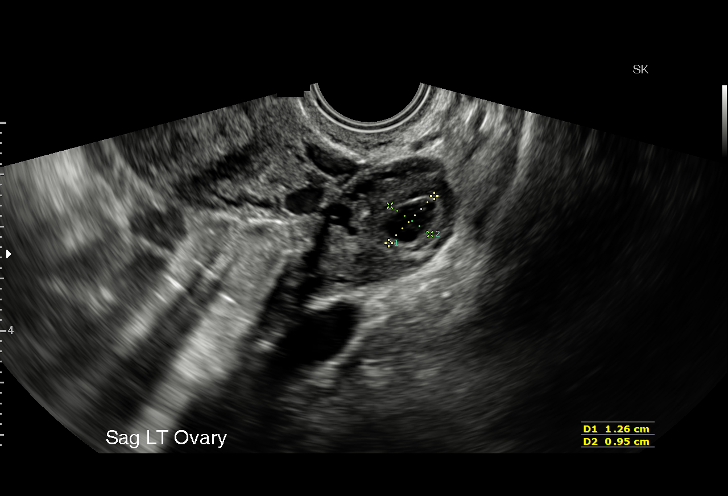
[im 26/37]
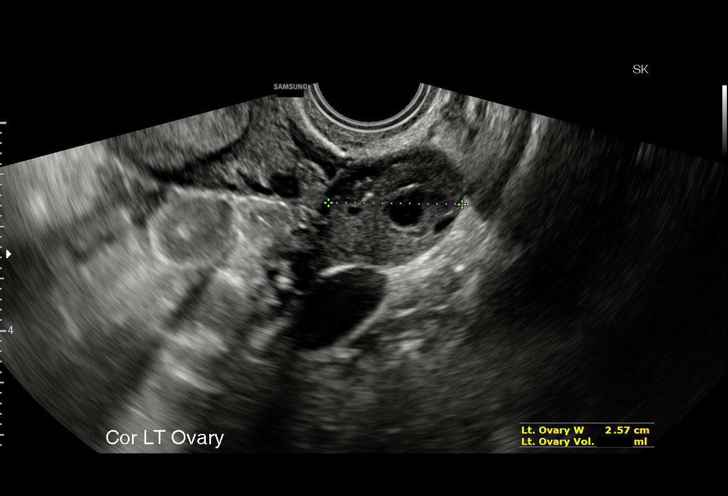
[im 29/37]
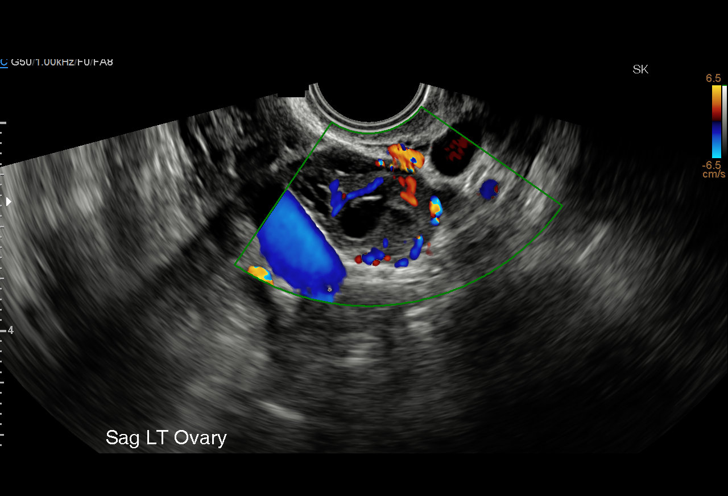
[im 31/37]
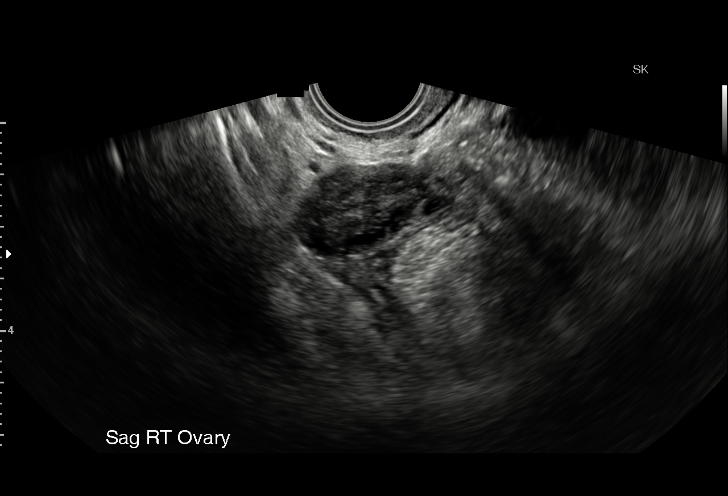
[im 34/37]
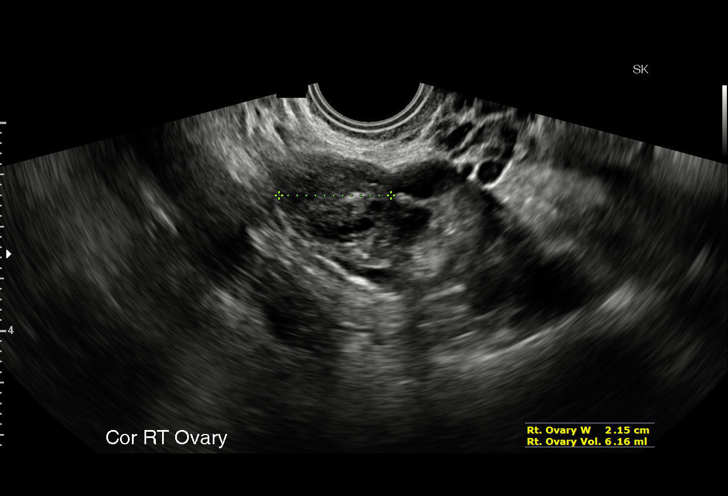
[im 37/37]
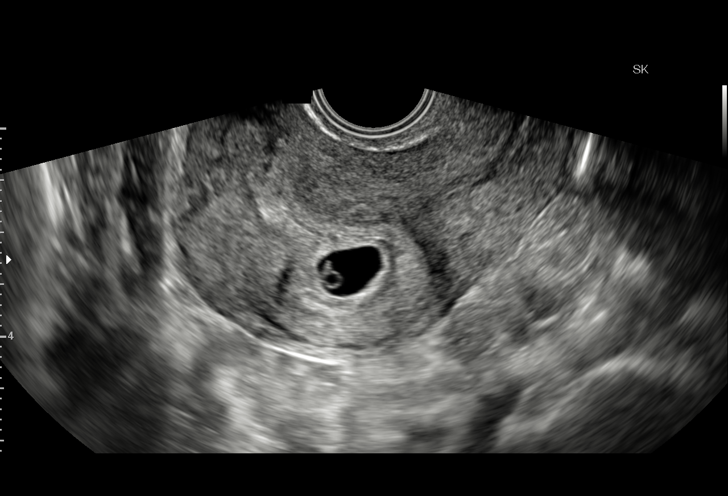

[15 of 28 positions shown; findings below may reference images not displayed]

FINDINGS: Intrauterine gestational sac: Visualized/normal in shape.

Yolk sac:  Present.

Embryo:  Present.

Cardiac Activity: Present.

Heart Rate: 114 bpm

CRL:   3.1  mm   5 w 6 d                  US EDC: 08/21/2016

Subchorionic hemorrhage:  Tiny.

Maternal uterus/adnexae: Corpus luteal cyst in the left ovary
measures 1.3 x 1 x 1.1 cm. Normal blood flow seen. The right ovary
is normal. No pelvic free fluid.
IMPRESSION: Single live intrauterine pregnancy estimated gestational age 5 weeks
6 days for estimated date of delivery 08/21/2016. Tiny subchorionic
hemorrhage.

## 2018-02-05 ENCOUNTER — Encounter (HOSPITAL_COMMUNITY): Payer: Self-pay

## 2018-02-05 ENCOUNTER — Ambulatory Visit (HOSPITAL_COMMUNITY)
Admission: EM | Admit: 2018-02-05 | Discharge: 2018-02-05 | Disposition: A | Discharge status: Home / Self Care | Payer: BLUE CROSS/BLUE SHIELD | Attending: Family Medicine | Admitting: Family Medicine

## 2018-02-05 ENCOUNTER — Other Ambulatory Visit: Payer: Self-pay

## 2018-02-05 DIAGNOSIS — G43909 Migraine, unspecified, not intractable, without status migrainosus: Secondary | ICD-10-CM | POA: Diagnosis not present

## 2018-02-05 DIAGNOSIS — J029 Acute pharyngitis, unspecified: Secondary | ICD-10-CM

## 2018-02-05 DIAGNOSIS — F329 Major depressive disorder, single episode, unspecified: Secondary | ICD-10-CM | POA: Insufficient documentation

## 2018-02-05 DIAGNOSIS — R509 Fever, unspecified: Secondary | ICD-10-CM | POA: Insufficient documentation

## 2018-02-05 DIAGNOSIS — R6883 Chills (without fever): Secondary | ICD-10-CM | POA: Diagnosis present

## 2018-02-05 LAB — POCT RAPID STREP A: STREPTOCOCCUS, GROUP A SCREEN (DIRECT): NEGATIVE

## 2018-02-05 MED ORDER — KETOROLAC TROMETHAMINE 60 MG/2ML IM SOLN
60.0000 mg | Freq: Once | INTRAMUSCULAR | Status: AC
  Administered 2018-02-05: 60 mg via INTRAMUSCULAR

## 2018-02-05 MED ORDER — KETOROLAC TROMETHAMINE 60 MG/2ML IM SOLN
INTRAMUSCULAR | Status: AC
  Filled 2018-02-05: qty 2

## 2018-02-05 NOTE — ED Provider Notes (Signed)
  [facility]   Chief Complaint  Patient presents with  . Sore Throat    Linda Huber here for URI complaints.  Duration: 1 month, worse yesterday Associated symptoms: Fever (101 F), sinus congestion, sore throat and chills Denies: sinus pain, rhinorrhea, itchy watery eyes, ear pain, ear drainage, shortness of breath, myalgia and cough Treatment to date: Tylenol Sick contacts: No  Also has been having a migraine for around 3 weeks, wondering if there is anything we can do for that.   ROS:  Const: +fevers HEENT: As noted in HPI Lungs: No SOB  Past Medical History:  Diagnosis Date  . Depression    not on meds, doing good  . Infection    UTI  . Urinary tract infection    Family History  Problem Relation Age of Onset  . Leukemia Paternal Grandmother   . Sickle cell anemia Paternal Grandmother   . Stroke Maternal Grandfather   . Hyperlipidemia Father   . Asthma Mother   . Hypertension Mother   . Cancer Neg Hx   . Heart disease Neg Hx     BP 107/79 (BP Location: Left Arm)   Pulse 79   Temp 98.1 F (36.7 C) (Oral)   Resp 16   SpO2 100%  General: Awake, alert, appears stated age HEENT: AT, [place], ears patent b/l and TM's neg, nares patent w/o discharge, pharynx pink and without exudates- R sided tonsillith noted, MMM Neck: No masses or asymmetry Heart: RRR Lungs: CTAB, no accessory muscle use Psych: Age appropriate judgment and insight, normal mood and affect  Sore throat  Orders as above. 2/4 Centor (per hx), Rapid strep is neg. Given HA, toradol, she is neither pregnant nor breast feeding. Tylenol, NSAIDs OK starting tomorrow. Continue to push fluids, practice good hand hygiene, cover mouth when coughing. F/u prn. If starting to experience fevers, shaking, or shortness of breath, seek immediate care. Pt voiced understanding and agreement to the plan.  Jilda Rocheicholas Paul [place]op[place]end[place], [place] 02-05-18 1:11 PM    Sharlene DoryWendling, Kinan Safley Paul, DO 02/05/18 1311

## 2018-02-05 NOTE — Discharge Instructions (Signed)
OK to take Tylenol 1000 mg (2 extra strength tabs) or 975 mg (3 regular strength tabs) every 6 hours as needed.  Ibuprofen 400-600 mg (2-3 over the counter strength tabs) every 6 hours as needed for pain.  Claritin (loratadine), Allegra (fexofenadine), Zyrtec (cetirizine); these are listed in order from weakest to strongest. Generic, and therefore cheaper, options are in the parentheses.   Flonase (fluticasone); nasal spray that is over the counter. 2 sprays each nostril, once daily. Aim towards the same side eye when you spray.  There are available OTC, and the generic versions, which may be cheaper, are in parentheses. Show this to a pharmacist if you have trouble finding any of these items.

## 2018-02-05 NOTE — ED Triage Notes (Signed)
Pt presents today with a sore throat that has been bothering her for a month. States sxs worsened last night with fever and chills. Has had a persistent headache for 3 weeks that will not go away with tylenol.

## 2018-02-07 LAB — CULTURE, GROUP A STREP (THRC)

## 2018-02-22 ENCOUNTER — Ambulatory Visit (HOSPITAL_COMMUNITY)
Admission: EM | Admit: 2018-02-22 | Discharge: 2018-02-22 | Disposition: A | Discharge status: Home / Self Care | Payer: BLUE CROSS/BLUE SHIELD | Attending: Family Medicine | Admitting: Family Medicine

## 2018-02-22 ENCOUNTER — Encounter (HOSPITAL_COMMUNITY): Payer: Self-pay | Admitting: Emergency Medicine

## 2018-02-22 DIAGNOSIS — R0989 Other specified symptoms and signs involving the circulatory and respiratory systems: Secondary | ICD-10-CM

## 2018-02-22 DIAGNOSIS — F458 Other somatoform disorders: Secondary | ICD-10-CM

## 2018-02-22 NOTE — ED Triage Notes (Signed)
PT swallowed a large pill yesterday and immediately had throat and chest discomfort. Pill doesn't feel like it went down all the way.

## 2018-02-23 NOTE — ED Provider Notes (Signed)
  [facility]   [id number] 02/22/18 Arrival Time: 1900  ASSESSMENT & PLAN:  1. Globus sensation    No worsening. Question esophageal irritation from swallowing capsule. Able to eat/drink without swallowing difficulty.  OTC analgesics and throat care as needed. If this does not resolve over the next few days she will schedule f/u with her PCP or with GI. May f/u here as needed.  Reviewed expectations re: course of current medical issues. Questions answered. Outlined signs and symptoms indicating need for more acute intervention. Patient verbalized understanding. After Visit Summary given.   SUBJECTIVE:  Linda Huber is a 27 y.o. female who reports a discomfort with swallowing and chest discomfort. Started after she swallowed a large pill/capsule yesterday. "Felt like it got stuck in my throat." Dull discomfort of chest now. Solid and liquid PO intake normal without difficulty. No regurgitation of PO intake. No n/v. No respiratory symptoms. No abdominal pain. No h/o swallowing difficulties  OTC treatment: none.  ROS: As per HPI.   OBJECTIVE:  Vitals:   02/22/18 1950  BP: 117/83  Pulse: 80  Resp: 16  Temp: 98.6 F (37 C)  TempSrc: Oral  SpO2: 100%  Weight: 130 lb (59 kg)    General appearance: alert; no distress HEENT: throat with normal exam; uvula midline Neck: supple with FROM Lungs: clear to auscultation bilaterally CV: RRR Skin: warm and dry Psychological: alert and cooperative; normal mood and affect  No Known Allergies  Past Medical History:  Diagnosis Date  . Depression    not on meds, doing good  . Infection    UTI  . Urinary tract infection    Social History   Socioeconomic History  . Marital status: Married    Spouse name: Not on file  . Number of children: 3  . Years of education: Not on file  . Highest education level: Not on file  Social Needs  . Financial resource strain: Not on file  . Food insecurity - worry: Not on file    . Food insecurity - inability: Not on file  . Transportation needs - medical: Not on file  . Transportation needs - non-medical: Not on file  Occupational History  . Not on file  Tobacco Use  . Smoking status: Never Smoker  . Smokeless tobacco: Never Used  Substance and Sexual Activity  . Alcohol use: Yes    Comment: not with + preg  . Drug use: No  . Sexual activity: Yes    Birth control/protection: None  Other Topics Concern  . Not on file  Social History Narrative  . Not on file   Family History  Problem Relation Age of Onset  . Leukemia Paternal Grandmother   . Sickle cell anemia Paternal Grandmother   . Stroke Maternal Grandfather   . Hyperlipidemia Father   . Asthma Mother   . Hypertension Mother   . Cancer Neg Hx   . Heart disease Neg Hx           Mardella LaymanHagler, Niklas Chretien, MD 02/23/18 [phone]

## 2018-03-31 ENCOUNTER — Telehealth: Payer: Self-pay | Admitting: Internal Medicine

## 2018-03-31 NOTE — Telephone Encounter (Signed)
Copied from CRM [id number]. Topic: Appointment Scheduling - Scheduling Inquiry for Clinic >> Mar 31, 2018 12:09 PM Debroah LoopLander, Lumin L wrote: Reason for CRM: Patient has requested to transfer from [place] to Dr. Yetta BarreJones or Dr. Okey Duprerawford because [place] to [place].

## 2018-04-04 ENCOUNTER — Telehealth: Payer: Self-pay

## 2018-04-04 DIAGNOSIS — J3089 Other allergic rhinitis: Secondary | ICD-10-CM

## 2018-04-04 NOTE — Telephone Encounter (Signed)
Copied from CRM [id number]. Topic: Referral - Request >> Mar 31, 2018 12:12 PM Darletta MollLander, Lumin L wrote: Reason for CRM: Patient wants to be referred to an allergist for allergy injections. She prefers the [facility].

## 2018-04-04 NOTE — Telephone Encounter (Signed)
Left VM for pt to return phone call, need to ask further questions.TLG

## 2018-04-05 NOTE — Telephone Encounter (Signed)
Left VM for pt to let me know what kind of allergies she's having, how long she's been having them and if she taking any OTC allergy meds.TLG

## 2018-04-05 NOTE — Telephone Encounter (Signed)
Ok to enter referral 

## 2018-04-05 NOTE — Telephone Encounter (Signed)
PT states shes had allergies back as a kid,  Suffered from nosebleeds then,  She feels it has got worse over the years mainly  The last 7 years ,  Yes she has used otc, pretty much all that's out there including nasal spray  And she feels like its pollen in the air.

## 2018-04-05 NOTE — Telephone Encounter (Signed)
Patient returning call to [place]. Please advise. CB#: [phone]

## 2018-04-07 NOTE — Telephone Encounter (Signed)
Yes, I will see her 

## 2018-04-07 NOTE — Telephone Encounter (Signed)
Referral created for Allergist. TLG

## 2018-04-08 NOTE — Telephone Encounter (Signed)
appt sch °

## 2018-04-12 ENCOUNTER — Encounter: Payer: Self-pay | Admitting: Family Medicine

## 2018-04-12 ENCOUNTER — Ambulatory Visit: Payer: Self-pay

## 2018-04-12 ENCOUNTER — Ambulatory Visit: Payer: 59 | Admitting: Family Medicine

## 2018-04-12 DIAGNOSIS — J302 Other seasonal allergic rhinitis: Secondary | ICD-10-CM | POA: Diagnosis not present

## 2018-04-12 MED ORDER — HYPROMELLOSE (GONIOSCOPIC) 2.5 % OP SOLN: 1.0000 [drp] | Freq: Three times a day (TID) | OPHTHALMIC | 12 refills | Status: DC | PRN

## 2018-04-12 NOTE — Patient Instructions (Signed)
Please try the nasal steroid on a regular basis with the anti-histamine  Please let me know if your symptoms don't improve.

## 2018-04-12 NOTE — Telephone Encounter (Signed)
Phone call from pt.  Requested an appt. For coughing and wheezing, that is worse at night.  Reported cough started about one week ago.  Coughing up yellow-green mucus.  Reported fever up to 101 degrees for 2 nights; denied fever today.  Also c/o nasal congestion, itchy and puffy eyes.  Reported has been taking Nyquil at night, and Nasonex nasal spray.  Also, stated her stomach hurts, from coughing so much.  Appt. scheduled for today, at 3:40 PM.  Pt. agrees with plan.        Reason for Disposition . [1] Continuous (nonstop) coughing interferes with work or school AND [2] no improvement using cough treatment per Care Advice    Reported her cough is worse at night, and she is not getting much sleep  Answer Assessment - Initial Assessment Questions 1. ONSET: "When did the cough begin?"       1 week ago  2. SEVERITY: "How bad is the cough today?"      Coughing moderately; can't sleep at night   3. RESPIRATORY DISTRESS: "Describe your breathing."      Shortness of breath with freq coughing ; intermittent wheezing 4. FEVER: "Do you have a fever?" If so, ask: "What is your temperature, how was it measured, and when did it start?"    Fever of 101 degrees x 2 days; now fever gone 5. SPUTUM: "Describe the color of your sputum" (clear, white, yellow, green)     Yellow-green 6. HEMOPTYSIS: "Are you coughing up any blood?" If so ask: "How much?" (flecks, streaks, tablespoons, etc.)     No  7. CARDIAC HISTORY: "Do you have any history of heart disease?" (e.g., heart attack, congestive heart failure)      No 8. LUNG HISTORY: "Do you have any history of lung disease?"  (e.g., pulmonary embolus, asthma, emphysema)     No 9. PE RISK FACTORS: "Do you have a history of blood clots?" (or: recent major surgery, recent prolonged travel, bedridden )     N/a  10. OTHER SYMPTOMS: "Do you have any other symptoms?" (e.g., runny nose, wheezing, chest pain)       Some nasal congestion, wheezing at intervals, shortness  of breath, abdominal soreness from cough  11. PREGNANCY: "Is there any chance you are pregnant?" "When was your last menstrual period?"       No; has an IUD 12. TRAVEL: "Have you traveled out of the country in the last month?" (e.g., travel history, exposures)       N/a  Protocols used: COUGH - ACUTE PRODUCTIVE-A-AH

## 2018-04-12 NOTE — Progress Notes (Signed)
Linda Huber - 27 y.o. female MRN [id number]  Date of birth: [birth year]  SUBJECTIVE:  Including CC & ROS.  Chief Complaint  Patient presents with  . Cough    Linda Huber is a 27 y.o. female that is presenting with a cough. Ongoing for one week.  Admits to productive cough, mucous green in color. Denies fevers. Admits to cough worsens at night. She has bene taking Mucinex and Nyquil with no improvement. Has itchy, watery eyes. Has tried OTC for her eyes. Has three little kids. One goes to school.    Review of Systems  Constitutional: Negative for fever.  HENT: Positive for congestion.   Eyes: Positive for itching.  Respiratory: Positive for cough.     HISTORY: Past Medical, Surgical, Social, and Family History Reviewed & Updated per EMR.   Pertinent Historical Findings include:  Past Medical History:  Diagnosis Date  . Depression    not on meds, doing good  . Infection    UTI  . Urinary tract infection     Past Surgical History:  Procedure Laterality Date  . BIOPSY BREAST    . WISDOM TOOTH EXTRACTION      No Known Allergies  Family History  Problem Relation Age of Onset  . Leukemia Paternal Grandmother   . Sickle cell anemia Paternal Grandmother   . Stroke Maternal Grandfather   . Hyperlipidemia Father   . Asthma Mother   . Hypertension Mother   . Cancer Neg Hx   . Heart disease Neg Hx      Social History   Socioeconomic History  . Marital status: Married    Spouse name: Not on file  . Number of children: 3  . Years of education: Not on file  . Highest education level: Not on file  Occupational History  . Not on file  Social Needs  . Financial resource strain: Not on file  . Food insecurity:    Worry: Not on file    Inability: Not on file  . Transportation needs:    Medical: Not on file    Non-medical: Not on file  Tobacco Use  . Smoking status: Never Smoker  . Smokeless tobacco: Never Used  Substance and Sexual Activity  . Alcohol use: Yes      Comment: not with + preg  . Drug use: No  . Sexual activity: Yes    Birth control/protection: None  Lifestyle  . Physical activity:    Days per week: Not on file    Minutes per session: Not on file  . Stress: Not on file  Relationships  . Social connections:    Talks on phone: Not on file    Gets together: Not on file    Attends religious service: Not on file    Active member of club or organization: Not on file    Attends meetings of clubs or organizations: Not on file    Relationship status: Not on file  . Intimate partner violence:    Fear of current or ex partner: Not on file    Emotionally abused: Not on file    Physically abused: Not on file    Forced sexual activity: Not on file  Other Topics Concern  . Not on file  Social History Narrative  . Not on file     PHYSICAL EXAM:  VS: BP 102/62 (BP Location: Left Arm, Patient Position: Sitting, Cuff Size: Normal)   Pulse 97   Temp 98.4 F (36.9 C) (  Oral)   Ht  (1.651 m)   Wt 130 lb (59 kg)   SpO2 97%   BMI 21.63 kg/m  Physical Exam Gen: NAD, alert, cooperative with exam, well-appearing ENT: normal lips, normal nasal mucosa, tympanic membranes clear and intact bilaterally, normal oropharynx, no cervical lymphadenopathy Eye: normal EOM, normal conjunctiva and lids CV:  no edema, +2 pedal pulses, regular rate and rhythm, S1-S2   Resp: no accessory muscle use, non-labored, clear to auscultation bilaterally, no crackles or wheezes Skin: no rashes, no areas of induration  Neuro: normal tone, normal sensation to touch Psych:  normal insight, alert and oriented MSK: Normal gait, normal strength       ASSESSMENT & PLAN:   Seasonal allergies Symptoms seem to be allergic in nature. Possible for viral in with child going to elementary school  - counseled on supportive care  - artificial tears  - given indications to follow up

## 2018-04-13 DIAGNOSIS — J301 Allergic rhinitis due to pollen: Secondary | ICD-10-CM | POA: Insufficient documentation

## 2018-04-13 DIAGNOSIS — J302 Other seasonal allergic rhinitis: Secondary | ICD-10-CM | POA: Insufficient documentation

## 2018-04-13 NOTE — Assessment & Plan Note (Signed)
>>  ASSESSMENT AND PLAN FOR SEASONAL ALLERGIES WRITTEN ON 04/13/2018  9:30 PM BY SCHMITZ, JEREMY E, MD  Symptoms seem to be allergic in nature. Possible for viral in with child going to elementary school  - counseled on supportive care  - artificial tears  - given indications to follow up

## 2018-04-13 NOTE — Assessment & Plan Note (Signed)
Symptoms seem to be allergic in nature. Possible for viral in with child going to elementary school  - counseled on supportive care  - artificial tears  - given indications to follow up

## 2018-04-15 ENCOUNTER — Telehealth: Payer: Self-pay | Admitting: Nurse Practitioner

## 2018-04-15 NOTE — Telephone Encounter (Signed)
Copied from CRM [id number]. Topic: Quick Communication - Rx Refill/Question >> Apr 15, 2018  2:41 PM Alexander Bergeron B wrote: Medication: hydroxypropyl methylcellulose / hypromellose (ISOPTO TEARS / GONIOVISC) 2.5 % ophthalmic solution [[id number]]   The [facility] states the medication is discontinued and they are not able to fill it, contact pharmacy to advise

## 2018-04-18 NOTE — Telephone Encounter (Signed)
Please advise, pharmacy stating they are no longer making this med.

## 2018-04-18 NOTE — Telephone Encounter (Signed)
Pt is aware.  

## 2018-04-18 NOTE — Telephone Encounter (Signed)
She can get systane OTC in place of this

## 2018-04-18 NOTE — Telephone Encounter (Signed)
See attached note.    ISOPTO TEARS/GONIOVISC 2.5% ophthalmic solution refill request.   Per pharmacy it has been discontinued and pharmacy not able to fill it.    Please call them and advise.  [phone].    [facility] [address] [place], [place] - [address]   Thanks.

## 2018-04-18 NOTE — Addendum Note (Signed)
Addended byLivingston Diones on: 04/18/2018 01:58 PM   Modules accepted: Orders

## 2018-05-10 ENCOUNTER — Ambulatory Visit: Payer: Self-pay | Admitting: Internal Medicine

## 2018-05-10 DIAGNOSIS — Z0289 Encounter for other administrative examinations: Secondary | ICD-10-CM

## 2018-05-12 ENCOUNTER — Ambulatory Visit: Payer: 59 | Admitting: Allergy

## 2018-05-12 ENCOUNTER — Encounter: Payer: Self-pay | Admitting: Allergy

## 2018-05-12 VITALS — BP 90/60 | HR 75 | Temp 97.9°F | Resp 18 | Ht 65.3 in | Wt 133.6 lb

## 2018-05-12 DIAGNOSIS — J309 Allergic rhinitis, unspecified: Secondary | ICD-10-CM

## 2018-05-12 DIAGNOSIS — H1010 Acute atopic conjunctivitis, unspecified eye: Secondary | ICD-10-CM

## 2018-05-12 MED ORDER — FLUTICASONE PROPIONATE 93 MCG/ACT NA EXHU: 2.0000 | INHALANT_SUSPENSION | Freq: Two times a day (BID) | NASAL | 5 refills | Status: DC

## 2018-05-12 MED ORDER — MONTELUKAST SODIUM 10 MG PO TABS: 10.0000 mg | ORAL_TABLET | Freq: Every day | ORAL | 5 refills | Status: DC

## 2018-05-12 MED ORDER — CARBINOXAMINE MALEATE 6 MG PO TABS: 6.0000 mg | ORAL_TABLET | Freq: Two times a day (BID) | ORAL | 5 refills | Status: DC

## 2018-05-12 MED ORDER — OLOPATADINE HCL 0.2 % OP SOLN: 1.0000 [drp] | Freq: Every day | OPHTHALMIC | 5 refills | Status: DC

## 2018-05-12 MED ORDER — AZELASTINE HCL 0.1 % NA SOLN: 2.0000 | Freq: Two times a day (BID) | NASAL | 5 refills | Status: DC

## 2018-05-12 NOTE — Patient Instructions (Addendum)
Allergies   - skin testing unable to be performed today due to non-reactive histamine response   - will obtain environmental allergy panel.  We will call you with results.    - for management of allergy symptoms will have you start the following regimen:       - Ryvent  twice a day       - singulair  daily - take at bedtime       - Xhance device  - this is a specialized nasal spray that allows for deeper deposition of nasal steroid spray into the sinuses.  Use 2 sprays each nostril twice a day for nasal congestion       - for nasal drainage/post-nasal drip recommend use of nasal antihistamine Astelin, 2 sprays each nostril twice a day       - use nasal sprays with proper nasal spray technique.   Xhance demonstration shown today       - for itchy/watery/red eyes use Pataday 1 drop each eye as needed daily    - if your environmental allergy panel returns with positive results you would be eligible for allergen immunotherapy (allergy shots) which we will discuss after labs return.  If allergy panel is negative then recommend performing skin testing off antihistamine for 3 days.   Follow-up 3-4 months or sooner if needed

## 2018-05-12 NOTE — Progress Notes (Signed)
New Patient Note  RE: Linda Huber MRN: [id number] DOB: 09/22/1991 Date of Office Visit: 05/12/2018  Referring provider: Anne Ng, NP Primary care provider: Anne Ng, NP  Chief Complaint: seasonal allergies  History of present illness: Linda Huber is a 27 y.o. female presenting today for consultation for seasonal allergies.   She states during pollen season she has horrible allergy symptoms.  Symptoms can occur year-round.  She reports sneezing, nosebleeds, sinus HA, red and puffy eyes, nasal itch, PND, nasal congestion.  She was using OTC nasal steroid sprays that would help very temporarily.  She has also tried Careers adviser, zyrtec, claritin and xyzal which none have provided much relief.  She has also tried OTC eye drops as well.  She states when she was pregnant her allergy symptoms were worse.  There is a dog in the home and at one point thought she might be allergic to dog.    She has no history of asthma.  She has a history of eczema which is mild and states may have flare during summer in arm folds.  She reports she will use her daughter's creams as she has eczema.  No history of food allergy or current concerns.    Review of systems: Review of Systems  Constitutional: Negative for chills, fever and malaise/fatigue.  HENT: Positive for congestion. Negative for ear discharge, ear pain, nosebleeds and sore throat.   Eyes: Positive for redness. Negative for pain and discharge.  Respiratory: Negative for cough, shortness of breath and wheezing.   Cardiovascular: Negative for chest pain.  Gastrointestinal: Negative for abdominal pain, constipation, diarrhea, heartburn, nausea and vomiting.  Musculoskeletal: Negative for joint pain.  Skin: Negative for itching and rash.  Neurological: Negative for headaches.    All other systems negative unless noted above in HPI  Past medical history: Past Medical History:  Diagnosis Date  . Depression    not on meds,  doing good  . Eczema   . Infection    UTI  . Urinary tract infection     Past surgical history: Past Surgical History:  Procedure Laterality Date  . BIOPSY BREAST    . WISDOM TOOTH EXTRACTION      Family history:  Family History  Problem Relation Age of Onset  . Leukemia Paternal Grandmother   . Sickle cell anemia Paternal Grandmother   . Stroke Maternal Grandfather   . Hyperlipidemia Father   . Asthma Mother   . Hypertension Mother   . Allergic rhinitis Mother   . Urticaria Mother   . Eczema Daughter   . Cancer Neg Hx   . Heart disease Neg Hx     Social history: Lives in a home with carpeting with electric heating.  Dog in the home. No concern for water damage, mildew or roaches in the home.  She is a SAHM.  Denies smoking history.     Medication List: Allergies as of 05/12/2018   No Known Allergies     Medication List        Accurate as of 05/12/18 12:05 PM. Always use your most recent med list.          azelastine 0.1 % nasal spray Commonly known as:  ASTELIN Place 2 sprays into both nostrils 2 (two) times daily.   Carbinoxamine Maleate 6 MG Tabs Commonly known as:  RYVENT Take 6 mg by mouth 2 (two) times daily.   Fluticasone Propionate 93 MCG/ACT Exhu Commonly known as:  CIT Group  2 sprays into the nose 2 (two) times daily.   montelukast 10 MG tablet Commonly known as:  SINGULAIR Take 1 tablet (10 mg total) by mouth at bedtime.   Olopatadine HCl 0.2 % Soln Commonly known as:  PATADAY Place 1 drop into both eyes daily.       Known medication allergies: No Known Allergies   Physical examination: Blood pressure 90/60, pulse 75, temperature 97.9 F (36.6 C), temperature source Oral, resp. rate 18, height 5' 5.3" (1.659 m), weight 133 lb 9.6 oz (60.6 kg), SpO2 98 %, unknown if currently breastfeeding.  General: Alert, interactive, in no acute distress. HEENT: PERRLA, TMs pearly gray, turbinates moderately edematous R>L with clear  discharge, post-pharynx non erythematous. Neck: Supple without lymphadenopathy. Lungs: Clear to auscultation without wheezing, rhonchi or rales. {no increased work of breathing. CV: Normal S1, S2 without murmurs. Abdomen: Nondistended, nontender. Skin: Warm and dry, without lesions or rashes. Extremities:  No clubbing, cyanosis or edema. Neuro:   Grossly intact.  Diagnositics/Labs:  Allergy testing: histamine control was non-reactive due to nyquil use last night  Assessment and plan:   Allergic rhinoconjunctivitis   - skin testing unable to be performed today due to non-reactive histamine response   - will obtain environmental allergy panel.  We will call you with results.    - for management of allergy symptoms will have you start the following regimen:       - Ryvent  twice a day       - singulair  daily - take at bedtime       - Xhance device  - this is a specialized nasal spray that allows for deeper deposition of nasal steroid spray into the sinuses.  Use 2 sprays each nostril twice a day for nasal congestion       - for nasal drainage/post-nasal drip recommend use of nasal antihistamine Astelin, 2 sprays each nostril twice a day       - use nasal sprays with proper nasal spray technique.   Xhance demonstration shown today       - for itchy/watery/red eyes use Pataday 1 drop each eye as needed daily    - if your environmental allergy panel returns with positive results you would be eligible for allergen immunotherapy (allergy shots) which we will discuss after labs return.  If allergy panel is negative then recommend performing skin testing off antihistamine for 3 days.   Follow-up 3-4 months or sooner if needed  I appreciate the opportunity to take part in Kamoni's care. Please do not hesitate to contact me with questions.  Sincerely,   Margo Aye, MD Allergy/Immunology [facility]

## 2018-05-18 LAB — IGE+ALLERGENS ZONE 3(27)
Alternaria Alternata IgE: <0.1 kU/L
Bahia Grass IgE: 22.1 kU/L — AB
Bermuda Grass IgE: 16.9 kU/L — AB
Cedar, Mountain IgE: 0.37 kU/L — AB
Cladosporium Herbarum IgE: <0.1 kU/L
Common Silver Birch IgE: 2.53 kU/L — AB
D001-IGE D PTERONYSSINUS: 2.49 kU/L — AB
D002-IGE D FARINAE: 2.39 kU/L — AB
E005-IGE DOG DANDER: 0.17 kU/L — AB
G010-IGE JOHNSON GRASS: 19.7 kU/L — AB
Hickory, White IgE: 43.6 kU/L — AB
I206-IGE COCKROACH, AMERICAN: 0.29 kU/L — AB
IGE (IMMUNOGLOBULIN E), SERUM: 223 [IU]/mL (ref 6–495)
Kentucky Bluegrass IgE: 71.2 kU/L — AB
Mucor Racemosus IgE: <0.1 kU/L
Nettle IgE: 0.36 kU/L — AB
Penicillium Chrysogen IgE: <0.1 kU/L
Plantain, English IgE: 0.45 kU/L — AB
Ragweed, Short IgE: 1.33 kU/L — AB
T001-IGE MAPLE/BOX ELDER: 0.36 kU/L — AB
T007-IGE OAK, WHITE: 0.53 kU/L — AB
T008-IGE ELM, AMERICAN: 1.7 kU/L — AB
W014-IGE PIGWEED, ROUGH: 0.34 kU/L — AB
White Mulberry IgE: 2.23 kU/L — AB

## 2018-05-31 ENCOUNTER — Telehealth: Payer: Self-pay | Admitting: *Deleted

## 2018-05-31 NOTE — Telephone Encounter (Signed)
Patient called in regards to lab results. Writer reviewed labs with patient she verbalized understanding

## 2018-11-21 ENCOUNTER — Ambulatory Visit: Payer: 59 | Admitting: Nurse Practitioner

## 2018-11-21 DIAGNOSIS — Z0289 Encounter for other administrative examinations: Secondary | ICD-10-CM

## 2018-12-19 ENCOUNTER — Ambulatory Visit (INDEPENDENT_AMBULATORY_CARE_PROVIDER_SITE_OTHER): Payer: 59 | Admitting: Nurse Practitioner

## 2018-12-19 ENCOUNTER — Encounter: Payer: Self-pay | Admitting: Nurse Practitioner

## 2018-12-19 VITALS — BP 100/66 | HR 90 | Temp 98.4°F | Ht 65.3 in | Wt 132.0 lb

## 2018-12-19 DIAGNOSIS — R42 Dizziness and giddiness: Secondary | ICD-10-CM | POA: Diagnosis not present

## 2018-12-19 DIAGNOSIS — R51 Headache: Secondary | ICD-10-CM | POA: Diagnosis not present

## 2018-12-19 DIAGNOSIS — R519 Headache, unspecified: Secondary | ICD-10-CM

## 2018-12-19 DIAGNOSIS — R632 Polyphagia: Secondary | ICD-10-CM | POA: Diagnosis not present

## 2018-12-19 LAB — CBC WITH DIFFERENTIAL/PLATELET
Basophils Absolute: 0 10*3/uL (ref 0.0–0.1)
Basophils Relative: 0.5 % (ref 0.0–3.0)
EOS ABS: 0.2 10*3/uL (ref 0.0–0.7)
EOS PCT: 3.3 % (ref 0.0–5.0)
HCT: 36.6 % (ref 36.0–46.0)
HEMOGLOBIN: 12.4 g/dL (ref 12.0–15.0)
LYMPHS ABS: 2 10*3/uL (ref 0.7–4.0)
LYMPHS PCT: 27.5 % (ref 12.0–46.0)
MCHC: 33.9 g/dL (ref 30.0–36.0)
MCV: 88 fl (ref 78.0–100.0)
MONO ABS: 0.5 10*3/uL (ref 0.1–1.0)
Monocytes Relative: 6.3 % (ref 3.0–12.0)
NEUTROS PCT: 62.4 % (ref 43.0–77.0)
Neutro Abs: 4.6 10*3/uL (ref 1.4–7.7)
Platelets: 200 10*3/uL (ref 150.0–400.0)
RBC: 4.16 Mil/uL (ref 3.87–5.11)
RDW: 13 % (ref 11.5–15.5)
WBC: 7.4 10*3/uL (ref 4.0–10.5)

## 2018-12-19 LAB — BASIC METABOLIC PANEL
BUN: 13 mg/dL (ref 6–23)
CHLORIDE: 105 meq/L (ref 96–112)
CO2: 27 meq/L (ref 19–32)
Calcium: 9.3 mg/dL (ref 8.4–10.5)
Creatinine, Ser: 0.67 mg/dL (ref 0.40–1.20)
GFR: 111.68 mL/min (ref >60.00)
GLUCOSE: 71 mg/dL (ref 70–99)
Potassium: 4.1 mEq/L (ref 3.5–5.1)
SODIUM: 138 meq/L (ref 135–145)

## 2018-12-19 LAB — TSH: TSH: 1.26 u[IU]/mL (ref 0.35–4.50)

## 2018-12-19 NOTE — Progress Notes (Signed)
Subjective:  Patient ID: Linda Huber, female    DOB: 02-21-1991  Age: 28 y.o. MRN: [id number]  CC: Dizziness (pt is c/o of dizzy,light headed,headache,blurr vision/going on 2 month on and off/never get eye check/ )  Dizziness  This is a new problem. The current episode started more than 1 month ago. The problem occurs intermittently. The problem has been waxing and waning. Pertinent negatives include no anorexia, chills, congestion, coughing, diaphoresis, fatigue, fever, headaches, myalgias, nausea, neck pain, numbness, sore throat, vertigo or weakness.   Dizziness and headache, Fogginess and room spinning x 74months. Onset after discontinuation of SSRI  IUD present with amenprrhea.  Reviewed past Medical, Social and Family history today.  Outpatient Medications Prior to Visit  Medication Sig Dispense Refill  . azelastine (ASTELIN) 0.1 % nasal spray Place 2 sprays into both nostrils 2 (two) times daily. (Patient not taking: Reported on 12/19/2018) 30 mL 5  . Carbinoxamine Maleate (RYVENT) 6 MG TABS Take 6 mg by mouth 2 (two) times daily. (Patient not taking: Reported on 12/19/2018) 60 tablet 5  . Fluticasone Propionate (XHANCE) 93 MCG/ACT EXHU Place 2 sprays into the nose 2 (two) times daily. (Patient not taking: Reported on 12/19/2018) 32 mL 5  . montelukast (SINGULAIR) 10 MG tablet Take 1 tablet (10 mg total) by mouth at bedtime. (Patient not taking: Reported on 12/19/2018) 30 tablet 5  . Olopatadine HCl (PATADAY) 0.2 % SOLN Place 1 drop into both eyes daily. (Patient not taking: Reported on 12/19/2018) 1 Bottle 5   No facility-administered medications prior to visit.     ROS See HPI  Objective:  BP 100/66   Pulse 90   Temp 98.4 F (36.9 C) (Oral)   Ht 5' 5.3" (1.659 m)   Wt 132 lb (59.9 kg)   SpO2 98%   BMI 21.76 kg/m   BP Readings from Last 3 Encounters:  12/19/18 100/66  05/12/18 90/60  04/12/18 102/62    Wt Readings from Last 3 Encounters:  12/19/18 132 lb (59.9 kg)    05/12/18 133 lb 9.6 oz (60.6 kg)  04/12/18 130 lb (59 kg)    Physical Exam Vitals signs reviewed.  Neck:     Musculoskeletal: Normal range of motion and neck supple.  Cardiovascular:     Rate and Rhythm: Normal rate and regular rhythm.     Pulses: Normal pulses.     Heart sounds: Normal heart sounds.  Pulmonary:     Effort: Pulmonary effort is normal.     Breath sounds: Normal breath sounds.  Abdominal:     General: Abdomen is flat. Bowel sounds are normal.     Palpations: Abdomen is soft.  Lymphadenopathy:     Cervical: No cervical adenopathy.  Neurological:     Mental Status: She is alert and oriented to person, place, and time.  Psychiatric:        Attention and Perception: Attention normal.        Mood and Affect: Mood is anxious.        Behavior: Behavior is cooperative.        Thought Content: Thought content does not include homicidal or suicidal ideation. Thought content does not include homicidal or suicidal plan.        Cognition and Memory: Cognition normal.        Judgment: Judgment normal.     Lab Results  Component Value Date   WBC 7.4 12/19/2018   HGB 12.4 12/19/2018   HCT 36.6 12/19/2018  PLT 200.0 12/19/2018   GLUCOSE 71 12/19/2018   CHOL 153 02/19/2017   TRIG 43.0 02/19/2017   HDL 45.30 02/19/2017   LDLCALC 99 02/19/2017   ALT 9 02/19/2017   AST 14 02/19/2017   NA 138 12/19/2018   K 4.1 12/19/2018   CL 105 12/19/2018   CREATININE 0.67 12/19/2018   BUN 13 12/19/2018   CO2 27 12/19/2018   TSH 1.26 12/19/2018    Assessment & Plan:   Jamarah was seen today for dizziness.  Diagnoses and all orders for this visit:  Acute intractable headache, unspecified headache type -     CBC w/Diff  Polyphagia -     Basic metabolic panel -     TSH  Dizziness -     CBC w/Diff -     TSH   I have discontinued Linda L. Isidro "[name]"'s Carbinoxamine Maleate, montelukast, Fluticasone Propionate, azelastine, and Olopatadine HCl.  No orders of the  defined types were placed in this encounter.   Problem List Items Addressed This Visit    None    Visit Diagnoses    Acute intractable headache, unspecified headache type    -  Primary   Relevant Orders   CBC w/Diff (Completed)   Polyphagia       Relevant Orders   Basic metabolic panel (Completed)   TSH (Completed)   Dizziness       Relevant Orders   CBC w/Diff (Completed)   TSH (Completed)       Follow-up: Return in about 3 months (around 03/20/2019) for CPE (fasting).  Alysia Penna, NP

## 2018-12-19 NOTE — Patient Instructions (Addendum)
Normal lab results  Symptoms may be related to abrupt discontinuation of prozac. Let me know if you want to use another antidepressant.  May appt with ophthalmology for comprehensive eye exam.  May use excedrin OTC for headache.

## 2018-12-21 ENCOUNTER — Encounter: Payer: Self-pay | Admitting: Nurse Practitioner

## 2019-01-11 ENCOUNTER — Ambulatory Visit (INDEPENDENT_AMBULATORY_CARE_PROVIDER_SITE_OTHER): Payer: 59 | Admitting: Nurse Practitioner

## 2019-01-11 ENCOUNTER — Encounter: Payer: Self-pay | Admitting: Nurse Practitioner

## 2019-01-11 VITALS — BP 106/66 | HR 83 | Temp 97.9°F | Ht 65.3 in | Wt 135.0 lb

## 2019-01-11 DIAGNOSIS — M67431 Ganglion, right wrist: Secondary | ICD-10-CM | POA: Diagnosis not present

## 2019-01-11 NOTE — Progress Notes (Signed)
   Subjective:  Patient ID: Linda Huber, female    DOB: 11-13-1991  Age: 28 y.o. MRN: [id number]  CC: Cyst (pt is complaining of "ball" on right wrist--painful--going on 3 wks. )  Wrist Pain   The pain is present in the right wrist. This is a new problem. The current episode started 1 to 4 weeks ago. The problem occurs constantly. The problem has been gradually worsening. The quality of the pain is described as aching. Associated symptoms include stiffness. Pertinent negatives include no fever, inability to bear weight, itching, joint locking, joint swelling, limited range of motion, numbness or tingling. The symptoms are aggravated by activity. She has tried nothing for the symptoms. There is no history of diabetes, gout or osteoarthritis.   Reviewed past Medical, Social and Family history today.  No outpatient medications prior to visit.   No facility-administered medications prior to visit.     ROS See HPI  Objective:  BP 106/66   Pulse 83   Temp 97.9 F (36.6 C) (Oral)   Ht 5' 5.3" (1.659 m)   Wt 135 lb (61.2 kg)   SpO2 98%   BMI 22.26 kg/m   BP Readings from Last 3 Encounters:  01/12/19 100/78  01/11/19 106/66  12/19/18 100/66    Wt Readings from Last 3 Encounters:  01/11/19 135 lb (61.2 kg)  12/19/18 132 lb (59.9 kg)  05/12/18 133 lb 9.6 oz (60.6 kg)    Physical Exam Musculoskeletal:        General: Tenderness present.     Right wrist: She exhibits tenderness. She exhibits normal range of motion, no bony tenderness, no effusion and no deformity.       Arms:     Right hand: Normal.     Lab Results  Component Value Date   WBC 7.4 12/19/2018   HGB 12.4 12/19/2018   HCT 36.6 12/19/2018   PLT 200.0 12/19/2018   GLUCOSE 71 12/19/2018   CHOL 153 02/19/2017   TRIG 43.0 02/19/2017   HDL 45.30 02/19/2017   LDLCALC 99 02/19/2017   ALT 9 02/19/2017   AST 14 02/19/2017   NA 138 12/19/2018   K 4.1 12/19/2018   CL 105 12/19/2018   CREATININE 0.67 12/19/2018     BUN 13 12/19/2018   CO2 27 12/19/2018   TSH 1.26 12/19/2018    Assessment & Plan:   Linda Huber was seen today for cyst.  Diagnoses and all orders for this visit:  Ganglion of right wrist -     Ambulatory referral to Sports Medicine   Linda Huber "[name]" does not currently have medications on file.  No orders of the defined types were placed in this encounter.   Problem List Items Addressed This Visit    None    Visit Diagnoses    Ganglion of right wrist    -  Primary   Relevant Orders   Ambulatory referral to Sports Medicine       Follow-up: No follow-ups on file.  Alysia Pennaharlotte Nche, NP

## 2019-01-11 NOTE — Patient Instructions (Signed)
Please f/up with Dr. Jordan Likes tomorrow at 10am.   Ganglion Cyst  A ganglion cyst is a non-cancerous, fluid-filled lump that occurs near a joint or tendon. The cyst grows out of a joint or the lining of a tendon. Ganglion cysts most often develop in the hand or wrist, but they can also develop in the shoulder, elbow, hip, knee, ankle, or foot. Ganglion cysts are ball-shaped or egg-shaped. Their size can range from the size of a pea to larger than a grape. Increased activity may cause the cyst to get bigger because more fluid starts to build up. What are the causes? The exact cause of this condition is not known, but it may be related to:  Inflammation or irritation around the joint.  An injury.  Repetitive movements or overuse.  Arthritis. What increases the risk? You are more likely to develop this condition if:  You are a woman.  You are 56-99 years old. What are the signs or symptoms? The main symptom of this condition is a lump. It most often appears on the hand or wrist. In many cases, there are no other symptoms, but a cyst can sometimes cause:  Tingling.  Pain.  Numbness.  Muscle weakness.  Weak grip.  Less range of motion in a joint. How is this diagnosed? Ganglion cysts are usually diagnosed based on a physical exam. Your health care provider will feel the lump and may shine a light next to it. If it is a ganglion cyst, the light will likely shine through it. Your health care provider may order an X-ray, ultrasound, or MRI to rule out other conditions. How is this treated? Ganglion cysts often go away on their own without treatment. If you have pain or other symptoms, treatment may be needed. Treatment is also needed if the ganglion cyst limits your movement or if it gets infected. Treatment may include:  Wearing a brace or splint on your wrist or finger.  Taking anti-inflammatory medicine.  Having fluid drained from the lump with a needle  (aspiration).  Getting a steroid injected into the joint.  Having surgery to remove the ganglion cyst.  Placing a pad on your shoe or wearing shoes that will not rub against the cyst if it is on your foot. Follow these instructions at home:  Do not press on the ganglion cyst, poke it with a needle, or hit it.  Take over-the-counter and prescription medicines only as told by your health care provider.  If you have a brace or splint: ? Wear it as told by your health care provider. ? Remove it as told by your health care provider. Ask if you need to remove it when you take a shower or a bath.  Watch your ganglion cyst for any changes.  Keep all follow-up visits as told by your health care provider. This is important. Contact a health care provider if:  Your ganglion cyst becomes larger or more painful.  You have pus coming from the lump.  You have weakness or numbness in the affected area.  You have a fever or chills. Get help right away if:  You have a fever and have any of these in the cyst area: ? Increased redness. ? Red streaks. ? Swelling. Summary  A ganglion cyst is a non-cancerous, fluid-filled lump that occurs near a joint or tendon.  Ganglion cysts most often develop in the hand or wrist, but they can also develop in the shoulder, elbow, hip, knee, ankle, or foot.  Ganglion cysts often go away on their own without treatment. This information is not intended to replace advice given to you by your health care provider. Make sure you discuss any questions you have with your health care provider. Document Released: 11/27/2000 Document Revised: 07/30/2017 Document Reviewed: 07/30/2017 Elsevier Interactive Patient Education  2019 [employer]

## 2019-01-12 ENCOUNTER — Encounter: Payer: Self-pay | Admitting: Nurse Practitioner

## 2019-01-12 ENCOUNTER — Ambulatory Visit (INDEPENDENT_AMBULATORY_CARE_PROVIDER_SITE_OTHER): Payer: 59 | Admitting: Family Medicine

## 2019-01-12 ENCOUNTER — Ambulatory Visit (INDEPENDENT_AMBULATORY_CARE_PROVIDER_SITE_OTHER): Payer: 59

## 2019-01-12 ENCOUNTER — Encounter: Payer: Self-pay | Admitting: Family Medicine

## 2019-01-12 VITALS — BP 100/78 | HR 77 | Temp 98.5°F | Ht 65.3 in

## 2019-01-12 DIAGNOSIS — M67431 Ganglion, right wrist: Secondary | ICD-10-CM | POA: Diagnosis not present

## 2019-01-12 MED ORDER — DICLOFENAC SODIUM 2 % TD SOLN: 1.0000 "application " | Freq: Two times a day (BID) | TRANSDERMAL | 3 refills | Status: DC

## 2019-01-12 NOTE — Progress Notes (Signed)
Linda LackFanny L Welker - 28 y.o. female MRN [id number]  Date of birth: 1991/10/12  SUBJECTIVE:  Including CC & ROS.  Chief Complaint  Patient presents with  . Wrist Pain    pt is here today for right wrist pain that has been ongoing for 2 weeks. pt sts the ain first appeared and then the cyst appeared.     Linda Huber is a 28 y.o. female that is presenting with right wrist pain and dorsal wrist ganglion.  This is been ongoing for a couple of weeks.  She has pain with flexion and extension of the wrist.  Is localized to the wrist itself.  Feels like the ganglion is gotten larger recently.  No overlying redness or streaking.  Has never had an injury or surgery to the wrist.  No numbness or tingling.  Does work around the house.  Has never had a history of similar symptoms.    Review of Systems  Constitutional: Negative for fever.  HENT: Negative for congestion.   Respiratory: Negative for cough.   Cardiovascular: Negative for chest pain.  Gastrointestinal: Negative for abdominal pain.  Musculoskeletal: Negative for back pain.  Skin: Negative for color change.  Neurological: Negative for weakness.  Hematological: Negative for adenopathy.  Psychiatric/Behavioral: Negative for agitation. Dysphoric mood:      HISTORY: Past Medical, Surgical, Social, and Family History Reviewed & Updated per EMR.   Pertinent Historical Findings include:  Past Medical History:  Diagnosis Date  . Depression    not on meds, doing good  . Eczema   . Infection    UTI  . Urinary tract infection     Past Surgical History:  Procedure Laterality Date  . BIOPSY BREAST    . WISDOM TOOTH EXTRACTION      No Known Allergies  Family History  Problem Relation Age of Onset  . Leukemia Paternal Grandmother   . Sickle cell anemia Paternal Grandmother   . Stroke Maternal Grandfather   . Hyperlipidemia Father   . Asthma Mother   . Hypertension Mother   . Allergic rhinitis Mother   . Urticaria Mother   . Eczema  Daughter   . Cancer Neg Hx   . Heart disease Neg Hx      Social History   Socioeconomic History  . Marital status: Married    Spouse name: Not on file  . Number of children: 3  . Years of education: Not on file  . Highest education level: Not on file  Occupational History  . Not on file  Social Needs  . Financial resource strain: Not on file  . Food insecurity:    Worry: Not on file    Inability: Not on file  . Transportation needs:    Medical: Not on file    Non-medical: Not on file  Tobacco Use  . Smoking status: Never Smoker  . Smokeless tobacco: Never Used  Substance and Sexual Activity  . Alcohol use: Yes    Comment: not with + preg  . Drug use: No  . Sexual activity: Yes    Birth control/protection: None  Lifestyle  . Physical activity:    Days per week: Not on file    Minutes per session: Not on file  . Stress: Not on file  Relationships  . Social connections:    Talks on phone: Not on file    Gets together: Not on file    Attends religious service: Not on file    Active member  of club or organization: Not on file    Attends meetings of clubs or organizations: Not on file    Relationship status: Not on file  . Intimate partner violence:    Fear of current or ex partner: Not on file    Emotionally abused: Not on file    Physically abused: Not on file    Forced sexual activity: Not on file  Other Topics Concern  . Not on file  Social History Narrative  . Not on file     PHYSICAL EXAM:  VS: BP 100/78   Pulse 77   Temp 98.5 F (36.9 C) (Oral)   Ht 5' 5.3" (1.659 m)   SpO2 99%   BMI 22.26 kg/m  Physical Exam Gen: NAD, alert, cooperative with exam, well-appearing ENT: normal lips, normal nasal mucosa,  Eye: normal EOM, normal conjunctiva and lids CV:  no edema, +2 pedal pulses   Resp: no accessory muscle use, non-labored,  Skin: no rashes, no areas of induration  Neuro: normal tone, normal sensation to touch Psych:  normal insight, alert and  oriented MSK:  Right wrist:  Mobile nontender cyst on the dorsal right wrist Normal wrist range of motion. Normal strength resistance. Normal finger abduction and abduction strength resistance. Neurovascular intact  Limited ultrasound: Right wrist:  Cystic structure that seems to be on the borders of the dorsal compartment tendons.  Summary: Ganglion cyst  Ultrasound and interpretation by Clare Gandy, MD   Aspiration/Injection Procedure Note Linda Huber 03/01/1991  Procedure: Aspiration Indications: right wrist cyst  Procedure Details Consent: Risks of procedure as well as the alternatives and risks of each were explained to the (patient/caregiver).  Consent for procedure obtained. Time Out: Verified patient identification, verified procedure, site/side was marked, verified correct patient position, special equipment/implants available, medications/allergies/relevent history reviewed, required imaging and test results available.  Performed.  The area was cleaned with iodine and alcohol swabs.    The right wrist cyst was injected using 3 cc's of 1% lidocaine with a 25 1 1/2" needle.  An 18-gauge inch and a half needle was inserted and aspiration was achieved.  Ultrasound was used. Images were obtained in long views showing the injection.    Amount of Fluid Aspirated: minimal amount Character of Fluid: gelatinous Fluid was sent for:n/a  A sterile dressing was applied.  Patient did tolerate procedure well.      ASSESSMENT & PLAN:   Ganglion of right wrist Right wrist ganglion was aspirated today.  Try to express everything but there was some residual. -Aspiration. -Counseled on supportive care. -If returns can consider aspiration again or referral to surgery.

## 2019-01-12 NOTE — Assessment & Plan Note (Signed)
Right wrist ganglion was aspirated today.  Try to express everything but there was some residual. -Aspiration. -Counseled on supportive care. -If returns can consider aspiration again or referral to surgery.

## 2019-01-12 NOTE — Patient Instructions (Signed)
Nice to meet you  Please see me back if the cyst returns.

## 2019-03-15 ENCOUNTER — Ambulatory Visit (INDEPENDENT_AMBULATORY_CARE_PROVIDER_SITE_OTHER): Payer: 59 | Admitting: Allergy

## 2019-03-15 ENCOUNTER — Encounter: Payer: Self-pay | Admitting: Allergy

## 2019-03-15 ENCOUNTER — Other Ambulatory Visit: Payer: Self-pay

## 2019-03-15 VITALS — BP 98/66 | HR 81 | Resp 16

## 2019-03-15 DIAGNOSIS — H1013 Acute atopic conjunctivitis, bilateral: Secondary | ICD-10-CM

## 2019-03-15 DIAGNOSIS — J3089 Other allergic rhinitis: Secondary | ICD-10-CM | POA: Diagnosis not present

## 2019-03-15 MED ORDER — AZELASTINE HCL 0.1 % NA SOLN: 2.0000 | Freq: Two times a day (BID) | NASAL | 5 refills | Status: DC

## 2019-03-15 MED ORDER — OLOPATADINE HCL 0.1 % OP SOLN: 1.0000 [drp] | Freq: Two times a day (BID) | OPHTHALMIC | 5 refills | Status: DC

## 2019-03-15 MED ORDER — MONTELUKAST SODIUM 10 MG PO TABS: 10.0000 mg | ORAL_TABLET | Freq: Every day | ORAL | 5 refills | Status: DC

## 2019-03-15 NOTE — Progress Notes (Signed)
Follow-up Note  RE: Linda Huber MRN: [id number] DOB: 1991/03/03 Date of Office Visit: 03/15/2019   History of present illness: Linda Huber is a 28 y.o. female presenting today for follow-up of allergic rhinitis with conjunctivitis.  She was last seen in the office on 05/12/18 by myself.  At this visit recommend she start use of Xhance to help with rhinitis however she states she did not notice a big difference with her nasal congestion thus she stopped.  She also does not recall ever taking Ryvent recommend at last visit and also has not been taking Singulair.  She also does not recall getting Astelin either from pharmacy.   She states that she has been having more nasal drainage, PND, itchy throat.  She has been taking [place] brand cetirizine as well as Flonase.  She states she has been using the flonase 4 times a day and has been having nosebleeds and feels like it is draining as she can taste a "metallic" like taste in mouth when she is having nosebleeds.  Nosebleed able to be stopped with pressure.   She is interested in allergen immunotherapy at this point.     Review of systems: Review of Systems  Constitutional: Negative for chills, fever and malaise/fatigue.  HENT: Positive for congestion. Negative for ear discharge, ear pain, nosebleeds, sinus pain and sore throat.   Eyes: Negative for pain, discharge and redness.  Respiratory: Negative for cough, shortness of breath and wheezing.   Cardiovascular: Negative for chest pain.  Gastrointestinal: Negative for abdominal pain, constipation, diarrhea, heartburn, nausea and vomiting.  Musculoskeletal: Negative for joint pain.  Skin: Negative for itching and rash.  Neurological: Negative for headaches.    All other systems negative unless noted above in HPI  Past medical/social/surgical/family history have been reviewed and are unchanged unless specifically indicated below.  No changes  Medication List: Allergies as of  03/15/2019   No Known Allergies     Medication List       Accurate as of March 15, 2019  3:14 PM. Always use your most recent med list.        azelastine 0.1 % nasal spray Commonly known as:  ASTELIN Place 2 sprays into both nostrils 2 (two) times daily.   Diclofenac Sodium 2 % Soln Commonly known as:  Pennsaid Place 1 application onto the skin 2 (two) times daily.   montelukast 10 MG tablet Commonly known as:  SINGULAIR Take 1 tablet (10 mg total) by mouth at bedtime.   olopatadine 0.1 % ophthalmic solution Commonly known as:  PATANOL Place 1 drop into both eyes 2 (two) times daily.       Known medication allergies: No Known Allergies   Physical examination: Blood pressure 98/66, pulse 81, resp. rate 16, SpO2 97 %, unknown if currently breastfeeding.  General: Alert, interactive, in no acute distress. HEENT: PERRLA, TMs pearly gray, turbinates moderately edematous with clear discharge, post-pharynx non erythematous. Neck: Supple without lymphadenopathy. Lungs: Clear to auscultation without wheezing, rhonchi or rales. {no increased work of breathing. CV: Normal S1, S2 without murmurs. Abdomen: Nondistended, nontender. Skin: Warm and dry, without lesions or rashes. Extremities:  No clubbing, cyanosis or edema. Neuro:   Grossly intact.  Diagnositics/Labs: Labs:  Component     Latest Ref Rng & Units 05/12/2018  IgE (Immunoglobulin E), Serum     6 - 495 IU/mL 223  D Pteronyssinus IgE     Class III kU/L 2.49 (A)  D Farinae IgE  Class III kU/L 2.39 (A)  Cat Dander IgE     Class 0 kU/L <0.10  Dog Dander IgE     Class 0/I kU/L 0.17 (A)  [place] Grass IgE     Class IV kU/L 16.90 (A)  [place] Bluegrass IgE     Class V kU/L 71.20 (A)  [place] Grass IgE     Class V kU/L 19.70 (A)  [place] Grass IgE     Class V kU/L 22.10 (A)  Cockroach, American IgE     Class 0/I kU/L 0.29 (A)  Penicillium Chrysogen IgE     Class 0 kU/L <0.10  Cladosporium Herbarum IgE      Class 0 kU/L <0.10  Aspergillus Fumigatus IgE     Class 0 kU/L <0.10  Mucor Racemosus IgE     Class 0 kU/L <0.10  Alternaria Alternata IgE     Class 0 kU/L <0.10  Stemphylium Herbarum IgE     Class 0 kU/L <0.10  Common Silver Charletta Cousin IgE     Class III kU/L 2.53 (A)  Oak, White IgE     Class I kU/L 0.53 (A)  Elm, American IgE     Class III kU/L 1.70 (A)  Maple/Box Elder IgE     Class I kU/L 0.36 (A)  Hickory, White IgE     Class V kU/L 43.60 (A)  White Mulberry IgE     Class III kU/L 2.23 (A)  Cedar, Mountain IgE     Class I kU/L 0.37 (A)  Ragweed, Short IgE     Class II kU/L 1.33 (A)  Plantain, English IgE     Class I kU/L 0.45 (A)  Pigweed, Rough IgE     Class I kU/L 0.34 (A)  Nettle IgE     Class I kU/L 0.36 (A)    Assessment and plan:   Allergic rhinitis with conjunctivitis   - continue allergen avoidance measures for tree pollens, weed pollens, grass pollens, cockroach and dust mites   - for management of allergy symptoms continue the following regimen:       - Continue your OTC antihistamine or use Xyzal 5mg  daily       - Singulair 10mg  daily - take at bedtime       - for nasal drainage/post-nasal drip recommend use of nasal antihistamine Astelin, 2 sprays each nostril twice a day       - use nasal sprays with proper nasal spray technique.         - use nasal saline spray 1-2 times a day to help keep nose moisturized and help decrease nosebleeds       - would hold your [place] brand fluticasone nasal spray at this could be causing nosebleeds       - for itchy/watery/red eyes use Patanol 1 drop each eye as twice day as needed       - allergen immunotherapy discussed today including protocol, benefits and risk.  Informational handout provided.  If interested in this therapuetic option you can check with your insurance carrier for coverage.  Let us know if you would like to proceed with this option.    Follow-up 4-6 months or sooner if needed  I appreciate the  opportunity to take part in [name]'s care. Please do not hesitate to contact me with questions.  Sincerely,   Margo Aye, MD Allergy/Immunology [facility]

## 2019-03-15 NOTE — Patient Instructions (Addendum)
Allergies   - continue allergen avoidance measures for tree pollens, weed pollens, grass pollens, cockroach and dust mites   - for management of allergy symptoms continue the following regimen:       - Continue your OTC antihistamine or use Xyzal 5mg  daily       - Singulair 10mg  daily - take at bedtime       - for nasal drainage/post-nasal drip recommend use of nasal antihistamine Astelin, 2 sprays each nostril twice a day       - use nasal sprays with proper nasal spray technique.         - use nasal saline spray 1-2 times a day to help keep nose moisturized and help decrease nosebleeds       - would hold your [place] brand fluticasone nasal spray at this could be causing nosebleeds       - for itchy/watery/red eyes use Patanol 1 drop each eye as twice day as needed       - allergen immunotherapy discussed today including protocol, benefits and risk.  Informational handout provided.  If interested in this therapuetic option you can check with your insurance carrier for coverage.  Let us know if you would like to proceed with this option.    Follow-up 4-6 months or sooner if needed

## 2019-03-20 ENCOUNTER — Encounter: Payer: 59 | Admitting: Nurse Practitioner

## 2019-03-29 DIAGNOSIS — J301 Allergic rhinitis due to pollen: Secondary | ICD-10-CM | POA: Diagnosis not present

## 2019-03-29 NOTE — Progress Notes (Signed)
VIALS EXP 03-28-2020 

## 2019-03-29 NOTE — Addendum Note (Signed)
Addended by: Lorrin Mais on: 03/29/2019 01:17 PM   Modules accepted: Orders

## 2019-03-30 DIAGNOSIS — J3089 Other allergic rhinitis: Secondary | ICD-10-CM | POA: Diagnosis not present

## 2019-04-07 ENCOUNTER — Ambulatory Visit (INDEPENDENT_AMBULATORY_CARE_PROVIDER_SITE_OTHER): Payer: 59 | Admitting: *Deleted

## 2019-04-07 ENCOUNTER — Other Ambulatory Visit: Payer: Self-pay

## 2019-04-07 DIAGNOSIS — J309 Allergic rhinitis, unspecified: Secondary | ICD-10-CM

## 2019-04-07 NOTE — Progress Notes (Signed)
Immunotherapy   Patient Details  Name: Linda Huber MRN: [id number] Date of Birth: 02/01/1991  04/07/2019  [name] started injections for  Dm[employer]roach [employer] Pollen-Pet Following schedule: B  Frequency:1 time per week Epi-Pen:Epi-Pen Available  Consent signed and patient instructions given. No problems after 30 minutes in the office.   Mariane Duval 04/07/2019, 4:13 PM

## 2019-04-10 ENCOUNTER — Ambulatory Visit (INDEPENDENT_AMBULATORY_CARE_PROVIDER_SITE_OTHER): Payer: 59 | Admitting: *Deleted

## 2019-04-10 DIAGNOSIS — J309 Allergic rhinitis, unspecified: Secondary | ICD-10-CM | POA: Diagnosis not present

## 2019-04-10 MED ORDER — EPINEPHRINE 0.3 MG/0.3ML IJ SOAJ: 0.3000 mg | Freq: Once | INTRAMUSCULAR | 2 refills | Status: AC

## 2019-04-17 ENCOUNTER — Ambulatory Visit (INDEPENDENT_AMBULATORY_CARE_PROVIDER_SITE_OTHER): Payer: 59

## 2019-04-17 DIAGNOSIS — J309 Allergic rhinitis, unspecified: Secondary | ICD-10-CM

## 2019-04-24 ENCOUNTER — Ambulatory Visit (INDEPENDENT_AMBULATORY_CARE_PROVIDER_SITE_OTHER): Payer: 59

## 2019-04-24 DIAGNOSIS — J309 Allergic rhinitis, unspecified: Secondary | ICD-10-CM

## 2019-05-05 ENCOUNTER — Ambulatory Visit (INDEPENDENT_AMBULATORY_CARE_PROVIDER_SITE_OTHER): Payer: 59 | Admitting: *Deleted

## 2019-05-05 DIAGNOSIS — J309 Allergic rhinitis, unspecified: Secondary | ICD-10-CM

## 2019-05-17 ENCOUNTER — Ambulatory Visit (INDEPENDENT_AMBULATORY_CARE_PROVIDER_SITE_OTHER): Payer: 59

## 2019-05-17 DIAGNOSIS — J309 Allergic rhinitis, unspecified: Secondary | ICD-10-CM

## 2019-05-26 ENCOUNTER — Ambulatory Visit (INDEPENDENT_AMBULATORY_CARE_PROVIDER_SITE_OTHER): Payer: 59 | Admitting: *Deleted

## 2019-05-26 DIAGNOSIS — J309 Allergic rhinitis, unspecified: Secondary | ICD-10-CM | POA: Diagnosis not present

## 2019-05-31 ENCOUNTER — Ambulatory Visit: Payer: Self-pay | Admitting: Nurse Practitioner

## 2019-05-31 NOTE — Telephone Encounter (Signed)
I'm having symptoms of COVID-19.  Should I be tested? See below for triage notes.   I transferred her call to [facility] in [place]'s office to schedule her.  I sent my notes to the office.  Reason for Disposition . [1] COVID-19 infection suspected by caller or triager AND [2] mild symptoms (cough, fever, or others) AND [8] no complications or SOB  Answer Assessment - Initial Assessment Questions 1. COVID-19 DIAGNOSIS: "Who made your Coronavirus (COVID-19) diagnosis?" "Was it confirmed by a positive lab test?" If not diagnosed by a HCP, ask "Are there lots of cases (community spread) where you live?" (See public health department website, if unsure)     Minor outbreak 2. ONSET: "When did the COVID-19 symptoms start?"      I have fever for 2 days, body aches chills, sore throat and headache.  Feel nauseas. 3. WORST SYMPTOM: "What is your worst symptom?" (e.g., cough, fever, shortness of breath, muscle aches)     Sore throat 4. COUGH: "Do you have a cough?" If so, ask: "How bad is the cough?"       Occasional cough 5. FEVER: "Do you have a fever?" If so, ask: "What is your temperature, how was it measured, and when did it start?"     This morning 101.4  Took Motrin  6. RESPIRATORY STATUS: "Describe your breathing?" (e.g., shortness of breath, wheezing, unable to speak)      I feel short of breath at night the worst but it's all the time. 7. BETTER-SAME-WORSE: "Are you getting better, staying the same or getting worse compared to yesterday?"  If getting worse, ask, "In what way?"     Same 8. HIGH RISK DISEASE: "Do you have any chronic medical problems?" (e.g., asthma, heart or lung disease, weak immune system, etc.)     No 9. PREGNANCY: "Is there any chance you are pregnant?" "When was your last menstrual period?"     No 10. OTHER SYMPTOMS: "Do you have any other symptoms?"  (e.g., chills, fatigue, headache, loss of smell or taste, muscle pain, sore throat)       See above   No travels.    No exposure to COVID-19.  Protocols used: CORONAVIRUS (COVID-19) DIAGNOSED OR SUSPECTED-A-AH

## 2019-06-01 ENCOUNTER — Ambulatory Visit (INDEPENDENT_AMBULATORY_CARE_PROVIDER_SITE_OTHER): Payer: 59 | Admitting: Family Medicine

## 2019-06-01 ENCOUNTER — Encounter: Payer: Self-pay | Admitting: Family Medicine

## 2019-06-01 ENCOUNTER — Other Ambulatory Visit: Payer: Self-pay

## 2019-06-01 ENCOUNTER — Telehealth: Payer: Self-pay | Admitting: *Deleted

## 2019-06-01 ENCOUNTER — Telehealth: Payer: Self-pay | Admitting: General Practice

## 2019-06-01 DIAGNOSIS — Z20822 Contact with and (suspected) exposure to covid-19: Secondary | ICD-10-CM

## 2019-06-01 DIAGNOSIS — R6889 Other general symptoms and signs: Secondary | ICD-10-CM | POA: Diagnosis not present

## 2019-06-01 DIAGNOSIS — Z20828 Contact with and (suspected) exposure to other viral communicable diseases: Secondary | ICD-10-CM

## 2019-06-01 NOTE — Assessment & Plan Note (Signed)
-  Symptoms consistent with viral etiology. Concern for COVID at this time.  Will forward to community testing pool to arrange for testing.   -Encouraged to remain well hydrated and maintain some nutrition with clear liquids and easily digestible foods.   -She may continue tylenol as needed for fever, aches and pains.   -Reviewed self isolation and recommended signing up for mychart to enroll in self monitoring at home.   -Red flags reviewed.

## 2019-06-01 NOTE — Telephone Encounter (Signed)
-----   Message from Luetta Nutting, DO sent at 06/01/2019  9:09 AM EDT ----- Regarding: COVID testing Patient: Linda Huber DOB: 1991-07-29 MRN:  [id number] Reason for test:  COVID-19 symptoms of fever, chills, headache, body aches, sore throat, cough and fatigue x4-5 days Insurance InfoChristella Scheuermann  ID:  [id number] GRP: [id number]  Thanks!

## 2019-06-01 NOTE — Addendum Note (Signed)
Addended by: Torrie Mayers on: 06/01/2019 10:12 AM   Modules accepted: Orders

## 2019-06-01 NOTE — Telephone Encounter (Signed)
Patient requested [phone] appointment scheduled for 11:15a this morning at the [facility] site be rescheduled to 11:45a. Informed to wear a mask and stay in vehicle.

## 2019-06-01 NOTE — Telephone Encounter (Signed)
Pt was seen MyChart/Doxy [facility] today. Task closed

## 2019-06-01 NOTE — Telephone Encounter (Signed)
Patient scheduled for Covid testing today at [facility]. Instructions given and order placed

## 2019-06-01 NOTE — Progress Notes (Signed)
Linda Huber - 28 y.o. female MRN [id number]  Date of birth: 04-04-1991   This visit type was conducted due to national recommendations for restrictions regarding the COVID-19 Pandemic (e.g. social distancing).  This format is felt to be most appropriate for this patient at this time.  All issues noted in this document were discussed and addressed.  No physical exam was performed (except for noted visual exam findings with Video Visits).  I discussed the limitations of evaluation and management by telemedicine and the availability of in person appointments. The patient expressed understanding and agreed to proceed.  I connected with@ on 06/01/19 at  9:00 AM EDT by a video enabled telemedicine application and verified that I am speaking with the correct person using two identifiers.   Patient Location: [place]  [id number]   Provider location:   Home office  Chief Complaint  Patient presents with  . Fever    COVID Sx/SY , temp 100.1, onset:6 days , unknown exposure,SOB at rest     HPI  Linda Huber is a 28 y.o. female who presents via audio/video conferencing for a telehealth visit today.  She  has complaint today of fever, body aches, headache, mild cough , sore throat, fatigue and decreased appetite.  Her symptoms started about 5 days ago.  She denies any known sick contacts and their are no other family members at home who are ill at this time.  She has been using tylenol for headache but hasn't had much improvement.  She denies chest pain or shortness of breath at this time.  She is self isolating at this time.   ROS:  A comprehensive ROS was completed and negative except as noted per HPI  Past Medical History:  Diagnosis Date  . Depression    not on meds, doing good  . Eczema   . Infection    UTI  . Urinary tract infection     Past Surgical History:  Procedure Laterality Date  . BIOPSY BREAST    . WISDOM TOOTH EXTRACTION      Family History   Problem Relation Age of Onset  . Leukemia Paternal Grandmother   . Sickle cell anemia Paternal Grandmother   . Stroke Maternal Grandfather   . Hyperlipidemia Father   . Asthma Mother   . Hypertension Mother   . Allergic rhinitis Mother   . Urticaria Mother   . Eczema Daughter   . Cancer Neg Hx   . Heart disease Neg Hx     Social History   Socioeconomic History  . Marital status: Married    Spouse name: Not on file  . Number of children: 3  . Years of education: Not on file  . Highest education level: Not on file  Occupational History  . Not on file  Social Needs  . Financial resource strain: Not on file  . Food insecurity    Worry: Not on file    Inability: Not on file  . Transportation needs    Medical: Not on file    Non-medical: Not on file  Tobacco Use  . Smoking status: Never Smoker  . Smokeless tobacco: Never Used  Substance and Sexual Activity  . Alcohol use: Yes    Comment: not with + preg  . Drug use: No  . Sexual activity: Yes    Birth control/protection: None  Lifestyle  . Physical activity    Days per week: Not on file    Minutes  per session: Not on file  . Stress: Not on file  Relationships  . Social Musicianconnections    Talks on phone: Not on file    Gets together: Not on file    Attends religious service: Not on file    Active member of club or organization: Not on file    Attends meetings of clubs or organizations: Not on file    Relationship status: Not on file  . Intimate partner violence    Fear of current or ex partner: Not on file    Emotionally abused: Not on file    Physically abused: Not on file    Forced sexual activity: Not on file  Other Topics Concern  . Not on file  Social History Narrative  . Not on file     Current Outpatient Medications:  .  azelastine (ASTELIN) 0.1 % nasal spray, Place 2 sprays into both nostrils 2 (two) times daily., Disp: 30 mL, Rfl: 5 .  EPINEPHrine 0.3 mg/0.3 mL IJ SOAJ injection, INJECT 0.3 MLS (0.3  MG TOTAL) INTO THE MUSCLE ONCE FOR 1 DOSE., Disp: , Rfl:  .  montelukast (SINGULAIR) 10 MG tablet, Take 1 tablet (10 mg total) by mouth at bedtime., Disp: 30 tablet, Rfl: 5  EXAM:  VITALS per patient if applicable: Ht 5' 5.3" (1.659 m)   Wt 135 lb (61.2 kg)   BMI 22.26 kg/m   GENERAL: alert, appears ill but non toxic.  She is in no distress.   HEENT: atraumatic, conjunttiva clear, no obvious abnormalities on inspection of external nose and ears  NECK: normal movements of the head and neck  LUNGS: on inspection no signs of respiratory distress, breathing rate appears normal, no obvious gross SOB, gasping or wheezing  CV: no obvious cyanosis  MS: moves all visible extremities without noticeable abnormality  PSYCH/NEURO: pleasant and cooperative, no obvious depression or anxiety, speech and thought processing grossly intact  ASSESSMENT AND PLAN:  Discussed the following assessment and plan:  Suspected Covid-19 Virus Infection -Symptoms consistent with viral etiology. Concern for COVID at this time.  Will forward to community testing pool to arrange for testing.   -Encouraged to remain well hydrated and maintain some nutrition with clear liquids and easily digestible foods.   -She may continue tylenol as needed for fever, aches and pains.   -Reviewed self isolation and recommended signing up for mychart to enroll in self monitoring at home.   -Red flags reviewed.        I discussed the assessment and treatment plan with the patient. The patient was provided an opportunity to ask questions and all were answered. The patient agreed with the plan and demonstrated an understanding of the instructions.   The patient was advised to call back or seek an in-person evaluation if the symptoms worsen or if the condition fails to improve as anticipated.   Everrett Coombeody Machelle Raybon, DO

## 2019-06-01 NOTE — Telephone Encounter (Signed)
Called pt at [facility] to schedule covid testing.  Pt's phone doesn't ring as if it is turned off, unable to lvm due to vm being to full.  Unable to reach pt to schedule at this time. Will try pt back later.

## 2019-06-05 LAB — NOVEL CORONAVIRUS, NAA: SARS-CoV-2, NAA: NOT DETECTED

## 2019-06-20 ENCOUNTER — Encounter: Payer: 59 | Admitting: Nurse Practitioner

## 2019-06-20 DIAGNOSIS — Z0289 Encounter for other administrative examinations: Secondary | ICD-10-CM

## 2019-07-19 ENCOUNTER — Encounter: Payer: Self-pay | Admitting: Nurse Practitioner

## 2019-11-22 DIAGNOSIS — Z975 Presence of (intrauterine) contraceptive device: Secondary | ICD-10-CM | POA: Insufficient documentation

## 2019-12-04 ENCOUNTER — Ambulatory Visit: Payer: 59 | Attending: Internal Medicine

## 2019-12-04 DIAGNOSIS — Z20822 Contact with and (suspected) exposure to covid-19: Secondary | ICD-10-CM

## 2019-12-05 LAB — NOVEL CORONAVIRUS, NAA: SARS-CoV-2, NAA: DETECTED — AB

## 2020-03-18 ENCOUNTER — Other Ambulatory Visit: Payer: Self-pay

## 2020-03-18 ENCOUNTER — Ambulatory Visit: Payer: 59 | Admitting: Podiatry

## 2020-03-18 DIAGNOSIS — M2042 Other hammer toe(s) (acquired), left foot: Secondary | ICD-10-CM

## 2020-03-18 DIAGNOSIS — M2041 Other hammer toe(s) (acquired), right foot: Secondary | ICD-10-CM

## 2020-03-18 DIAGNOSIS — L84 Corns and callosities: Secondary | ICD-10-CM

## 2020-03-21 NOTE — Progress Notes (Signed)
   HPI: 29 y.o. female presenting today as a new patient with a chief complaint of painful corns noted to the bilateral toes that have become gradually worse over the past two weeks. Walking increases the pain. She has not had any treatment for the symptoms. Patient is here for further evaluation and treatment.   Past Medical History:  Diagnosis Date  . Depression    not on meds, doing good  . Eczema   . Infection    UTI  . Urinary tract infection       Objective: Physical Exam General: The patient is alert and oriented x3 in no acute distress.  Dermatology: Hyperkeratotic lesion(s) present on toes 2-5 bilaterally. Pain on palpation with a central nucleated core noted. Skin is cool, dry and supple bilateral lower extremities. Negative for open lesions or macerations.  Vascular: Palpable pedal pulses bilaterally. No edema or erythema noted. Capillary refill within normal limits.  Neurological: Epicritic and protective threshold grossly intact bilaterally.   Musculoskeletal Exam: All pedal and ankle joints range of motion within normal limits bilateral. Muscle strength 5/5 in all groups bilateral. Hammertoe contracture deformity noted to digits 2-5 of the bilateral feet.  Assessment: 1. 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 Corns noted to toes 2-5 bilaterally    Plan of Care:  1. Patient evaluated.  2. Recommended good shoe gear.  3. Recommended conservative treatment at this time.  4. Revitaderm 40% urea provided to patient.  5. Return to clinic as needed.     Felecia Shelling, DPM [facility]  Dr. Felecia Shelling, DPM    [address] [place], [place] [address]                Office [phone]  Fax [phone]

## 2020-07-22 ENCOUNTER — Ambulatory Visit (INDEPENDENT_AMBULATORY_CARE_PROVIDER_SITE_OTHER): Payer: 59 | Admitting: Podiatry

## 2020-07-22 ENCOUNTER — Other Ambulatory Visit: Payer: Self-pay

## 2020-07-22 DIAGNOSIS — M2042 Other hammer toe(s) (acquired), left foot: Secondary | ICD-10-CM | POA: Diagnosis not present

## 2020-07-22 DIAGNOSIS — M2041 Other hammer toe(s) (acquired), right foot: Secondary | ICD-10-CM

## 2020-07-22 DIAGNOSIS — L84 Corns and callosities: Secondary | ICD-10-CM | POA: Diagnosis not present

## 2020-07-22 NOTE — Progress Notes (Signed)
   HPI: 29 y.o. female presenting today for follow-up evaluation of symptomatic hammertoe deformities that are causing painful corns to the dorsal aspect of the toes.  She has tried the OTC urea 40% with minimal relief.  She continues to have pain and tenderness to the toes bilateral.  She presents today for further treatment evaluation  Past Medical History:  Diagnosis Date  . Depression    not on meds, doing good  . Eczema   . Infection    UTI  . Urinary tract infection       Objective: Physical Exam General: The patient is alert and oriented x3 in no acute distress.  Dermatology: Hyperkeratotic lesion(s) present on toes 2-5 bilaterally.  The callus lesions or skin discoloration is most prominent overlying the PIPJ of the second and fourth toes bilateral.  Skin is cool, dry and supple bilateral lower extremities. Negative for open lesions or macerations.  Vascular: Palpable pedal pulses bilaterally. No edema or erythema noted. Capillary refill within normal limits.  Neurological: Epicritic and protective threshold grossly intact bilaterally.   Musculoskeletal Exam: All pedal and ankle joints range of motion within normal limits bilateral. Muscle strength 5/5 in all groups bilateral.  Elongated Morton's toe noted to the second digit bilateral.  There is also an adductovarus deformity noted significantly on the fourth toe bilateral  Assessment: 1.  Morton's toe second digit bilateral  2.  Adductovarus hammertoe fourth digit bilateral  3.  Corns noted to toes 2-5 bilaterally    Plan of Care:  1. Patient evaluated.  2. Today we discussed the conservative versus surgical management of the presenting pathology. The patient opts for surgical management. All possible complications and details of the procedure were explained. All patient questions were answered. No guarantees were expressed or implied. 3. Authorization for surgery was initiated today. Surgery will consist of PIPJ  arthroplasty second and fourth digit bilateral with percutaneous pin fixation 4.  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 recommended 2 weeks off of work.  She understands that she needs to wear the postsurgical shoes for 4 weeks     Felecia Shelling, DPM [facility]  Dr. Felecia Shelling, DPM    [address] [place], [place] [address]                Office [phone]  Fax [phone]

## 2020-08-08 ENCOUNTER — Encounter: Payer: Self-pay | Admitting: Nurse Practitioner

## 2020-08-08 ENCOUNTER — Telehealth (INDEPENDENT_AMBULATORY_CARE_PROVIDER_SITE_OTHER): Payer: 59 | Admitting: Nurse Practitioner

## 2020-08-08 VITALS — Ht 65.0 in | Wt 135.0 lb

## 2020-08-08 DIAGNOSIS — F458 Other somatoform disorders: Secondary | ICD-10-CM

## 2020-08-08 MED ORDER — OMEPRAZOLE 20 MG PO CPDR: 20.0000 mg | DELAYED_RELEASE_CAPSULE | Freq: Two times a day (BID) | ORAL | 0 refills | Status: DC

## 2020-08-08 NOTE — Progress Notes (Signed)
Virtual Visit via Video Note  I connected with@ on 08/08/20 at 11:30 AM EDT by a video enabled telemedicine application and verified that I am speaking with the correct person using two identifiers.  Location: Patient:Home Provider: Office Participants: patient and provider  I discussed the limitations of evaluation and management by telemedicine and the availability of in person appointments. I also discussed with the patient that there may be a patient responsible charge related to this service. The patient expressed understanding and agreed to proceed.  CC:Pt c/o loss of voice and throat pressure x2 weeks. Patient states throat is not sore but feels like it is closing up at times. Denies change in diet or start of new medication.  History of Present Illness: Ms. Holck presents with hoarseness in AM, full and tight sensation in her tht for last 3months. Worse in last 2weeks. Worse in AM and with excessive talking.  Denies any recent URI or strep throat exposure, no heartburn sesation, no dysphagia, no nausea, no weight loss. She states thyroid palpation and TSH level were evaluated by GYN within last 38month, normal per patient   Observations/Objective: Physical Exam Neck:     Thyroid: No thyroid tenderness.     Comments: Trachea midline Normal voice during video call Musculoskeletal:     Cervical back: Normal range of motion and neck supple.  Lymphadenopathy:     Cervical: No cervical adenopathy.  Neurological:     Mental Status: She is alert and oriented to person, place, and time.    Assessment and Plan: Rochele was seen today for acute visit.  Diagnoses and all orders for this visit:  Globus hystericus -     omeprazole (PRILOSEC) 20 MG capsule; Take 1 capsule (20 mg total) by mouth 2 (two) times daily before a meal.   Follow Up Instructions: Avoid ETOH, carbonated drinks, caffeine and food intake with in 2hrs of bedtime Start omeprazole as prescribed If no improvement  in 1week, will need referral to ENT.   I discussed the assessment and treatment plan with the patient. The patient was provided an opportunity to ask questions and all were answered. The patient agreed with the plan and demonstrated an understanding of the instructions.   The patient was advised to call back or seek an in-person evaluation if the symptoms worsen or if the condition fails to improve as anticipated.  Alysia Penna, NP

## 2020-08-08 NOTE — Patient Instructions (Signed)
Avoid ETOH, carbonated drinks, caffeine and food intake with in 2hrs of bedtime Start omeprazole as prescribed If no improvement in 1week, will need referral to ENT.

## 2020-08-13 ENCOUNTER — Telehealth: Payer: Self-pay

## 2020-08-13 NOTE — Telephone Encounter (Signed)
DOS 08/15/2020  HAMMERTOE REPAIR 2 & 4 B/L - 56154  [employer] EFFECTIVE DATE - 02/11/2018  PLAN DEDUCTIBLE - $2000.00 W/ $0.00 MET OUT OF POCKET - $4000.00 W/ $0.00 MET   PER AUTOMATED SYSTEM, NO PRECERT REQUIRED FOR CPT 28285.

## 2020-08-15 ENCOUNTER — Other Ambulatory Visit: Payer: Self-pay | Admitting: Podiatry

## 2020-08-15 DIAGNOSIS — M2042 Other hammer toe(s) (acquired), left foot: Secondary | ICD-10-CM | POA: Diagnosis not present

## 2020-08-15 DIAGNOSIS — M2041 Other hammer toe(s) (acquired), right foot: Secondary | ICD-10-CM | POA: Diagnosis not present

## 2020-08-15 MED ORDER — IBUPROFEN 800 MG PO TABS: 800.0000 mg | ORAL_TABLET | Freq: Three times a day (TID) | ORAL | 1 refills | Status: DC

## 2020-08-15 MED ORDER — OXYCODONE-ACETAMINOPHEN 5-325 MG PO TABS: 1.0000 | ORAL_TABLET | Freq: Four times a day (QID) | ORAL | 0 refills | Status: DC | PRN

## 2020-08-15 NOTE — Progress Notes (Signed)
PRN postop 

## 2020-08-21 ENCOUNTER — Ambulatory Visit (INDEPENDENT_AMBULATORY_CARE_PROVIDER_SITE_OTHER): Payer: 59 | Admitting: Podiatry

## 2020-08-21 ENCOUNTER — Other Ambulatory Visit: Payer: Self-pay

## 2020-08-21 ENCOUNTER — Ambulatory Visit (INDEPENDENT_AMBULATORY_CARE_PROVIDER_SITE_OTHER): Payer: 59

## 2020-08-21 DIAGNOSIS — M2041 Other hammer toe(s) (acquired), right foot: Secondary | ICD-10-CM

## 2020-08-21 DIAGNOSIS — M2042 Other hammer toe(s) (acquired), left foot: Secondary | ICD-10-CM

## 2020-08-21 DIAGNOSIS — Z9889 Other specified postprocedural states: Secondary | ICD-10-CM

## 2020-08-21 NOTE — Progress Notes (Signed)
   Subjective:  Patient presents today status post hammertoe repair digits 2, 4 bilateral feet. DOS: 08/15/2020.  Patient states she is having a moderate amount of pain to the bilateral feet.  She is taking the pain medication as directed.  She denies any nausea vomiting fever shortness of breath or chest pain.  She has been weightbearing in the postsurgical shoes as directed.  No new complaints at this time.  Past Medical History:  Diagnosis Date  . Depression    not on meds, doing good  . Eczema   . Infection    UTI  . Urinary tract infection       Objective/Physical Exam Neurovascular status intact.  Skin incisions appear to be well coapted with sutures and percutaneous fixation pins intact. No sign of infectious process noted. No dehiscence. No active bleeding noted. Moderate edema noted to the surgical extremity.  Radiographic Exam:  Percutaneous pins/K wires and osteotomies sites appear to be stable with routine healing.  Assessment: 1. s/p hammertoe repair digits 2, 4 bilateral. DOS: 08/15/2020   Plan of Care:  1. Patient was evaluated. X-rays reviewed 2.  Dressings changed today.  Keep clean dry and intact x1 week 3.  Continue minimal weightbearing and postsurgical shoes bilateral 4.  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 Felecia Shelling, DPM    [address]                                        [place], [place] [address]                Office [phone]  Fax [phone]

## 2020-08-26 ENCOUNTER — Encounter: Payer: Self-pay | Admitting: Podiatry

## 2020-08-26 ENCOUNTER — Other Ambulatory Visit: Payer: Self-pay | Admitting: Podiatry

## 2020-08-26 ENCOUNTER — Encounter: Payer: 59 | Admitting: Podiatry

## 2020-08-26 MED ORDER — OXYCODONE-ACETAMINOPHEN 5-325 MG PO TABS
1.0000 | ORAL_TABLET | Freq: Four times a day (QID) | ORAL | 0 refills | Status: DC | PRN
Start: 2020-08-26 — End: 2021-07-19

## 2020-08-27 ENCOUNTER — Ambulatory Visit: Payer: 59

## 2020-08-27 ENCOUNTER — Ambulatory Visit (INDEPENDENT_AMBULATORY_CARE_PROVIDER_SITE_OTHER): Payer: 59

## 2020-08-27 ENCOUNTER — Other Ambulatory Visit: Payer: Self-pay

## 2020-08-27 ENCOUNTER — Ambulatory Visit (INDEPENDENT_AMBULATORY_CARE_PROVIDER_SITE_OTHER): Payer: 59 | Admitting: Podiatry

## 2020-08-27 DIAGNOSIS — M2042 Other hammer toe(s) (acquired), left foot: Secondary | ICD-10-CM | POA: Diagnosis not present

## 2020-08-27 DIAGNOSIS — M2041 Other hammer toe(s) (acquired), right foot: Secondary | ICD-10-CM | POA: Diagnosis not present

## 2020-08-27 DIAGNOSIS — Z9889 Other specified postprocedural states: Secondary | ICD-10-CM

## 2020-08-27 NOTE — Progress Notes (Signed)
   Subjective:  Patient presents today status post hammertoe repair digits 2, 4 bilateral feet. DOS: 08/15/2020.  Patient states that yesterday morning she hit her toe against a kitchen cabinet and noticed that the toe was bent.  This is her surgical toe on the left foot.  She noticed and is significant amount of pain and tenderness to the area.  She presents today to have the toe evaluated.  Past Medical History:  Diagnosis Date  . Depression    not on meds, doing good  . Eczema   . Infection    UTI  . Urinary tract infection       Objective/Physical Exam Neurovascular status intact.  Skin incisions appear to be well coapted with sutures and percutaneous fixation pins intact. No sign of infectious process noted. No dehiscence. No active bleeding noted. Moderate edema noted to the surgical extremity.  The left second toe does appear to deviate at the level of the PIPJ surgical site.  Findings consistent with a bent percutaneous K wire  Radiographic Exam:  Percutaneous pins/K wires and osteotomies sites appear to be stable, however the percutaneous K wire to the left second toe is bent at the level of the PIPJ arthroplasty.  Assessment: 1. s/p hammertoe repair digits 2, 4 bilateral. DOS: 08/15/2020   Plan of Care:  1. Patient was evaluated. X-rays reviewed 2.  Initially the left second toe was blocked with 2 mL of 2% lidocaine plain and the percutaneous K wire was bent back into a rectus alignment and post reduction x-rays were taken.  Although the toe was in a rectus alignment it was dorsiflexed at the level of the PIPJ.  I was concerned that additional correction and bending of the wire may cause it to break so I decided to remove the percutaneous K wire. 3.  The percutaneous K wire was removed from the left second toe.  Dry sterile dressings were applied to the bilateral feet 4.  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 Felecia Shelling, DPM    [address]                                        [place], [place] [address]                Office [phone]  Fax [phone]

## 2020-08-27 NOTE — Progress Notes (Signed)
GF 

## 2020-08-28 ENCOUNTER — Encounter: Payer: 59 | Admitting: Podiatry

## 2020-09-04 ENCOUNTER — Other Ambulatory Visit: Payer: Self-pay

## 2020-09-04 ENCOUNTER — Ambulatory Visit (INDEPENDENT_AMBULATORY_CARE_PROVIDER_SITE_OTHER): Payer: 59

## 2020-09-04 ENCOUNTER — Encounter: Payer: Medicaid Other | Admitting: Podiatry

## 2020-09-04 ENCOUNTER — Ambulatory Visit (INDEPENDENT_AMBULATORY_CARE_PROVIDER_SITE_OTHER): Payer: 59 | Admitting: Podiatry

## 2020-09-04 DIAGNOSIS — L84 Corns and callosities: Secondary | ICD-10-CM

## 2020-09-04 DIAGNOSIS — M2041 Other hammer toe(s) (acquired), right foot: Secondary | ICD-10-CM

## 2020-09-04 DIAGNOSIS — Z9889 Other specified postprocedural states: Secondary | ICD-10-CM

## 2020-09-04 DIAGNOSIS — M2042 Other hammer toe(s) (acquired), left foot: Secondary | ICD-10-CM | POA: Diagnosis not present

## 2020-09-04 NOTE — Progress Notes (Signed)
   Subjective:  Patient presents today status post hammertoe repair digits 2, 4 bilateral feet. DOS: 08/15/2020.  Patient is doing well after injuring her left second toe last week.  There has been some improvement of the pain.  She presents she has been wearing the surgical shoes as directed.  No new complaints at this time  Past Medical History:  Diagnosis Date  . Depression    not on meds, doing good  . Eczema   . Infection    UTI  . Urinary tract infection       Objective/Physical Exam Neurovascular status intact.  Skin incisions appear to be well coapted with sutures and percutaneous fixation pins intact to the bilateral fourth toes and right second toe. No sign of infectious process noted. No dehiscence. No active bleeding noted. Moderate edema noted to the surgical toes.  Radiographic Exam:  Percutaneous K wires removed.  Arthroplasties noted to the surgical digits.  Toes are otherwise in a rectus alignment no new finding at this time  Assessment: 1. s/p hammertoe repair digits 2, 4 bilateral. DOS: 08/15/2020   Plan of Care:  1. Patient was evaluated. X-rays reviewed 2.  Percutaneous fixation pins removed today. 3.  Sutures removed today 4.  Continue wearing presurgical shoes x1 week.  After that she may transition into good supportive sneakers 5.  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 Felecia Shelling, DPM    [address]                                        [place], [place] [address]                Office [phone]  Fax [phone]

## 2020-09-11 ENCOUNTER — Encounter: Payer: 59 | Admitting: Podiatry

## 2020-10-02 ENCOUNTER — Encounter: Payer: 59 | Admitting: Podiatry

## 2020-11-18 LAB — OB RESULTS CONSOLE GC/CHLAMYDIA: Chlamydia: NEGATIVE

## 2020-11-27 LAB — OB RESULTS CONSOLE HIV ANTIBODY (ROUTINE TESTING): HIV: NONREACTIVE

## 2020-11-27 LAB — OB RESULTS CONSOLE HEPATITIS B SURFACE ANTIGEN: Hepatitis B Surface Ag: NEGATIVE

## 2020-12-14 NOTE — L&D Delivery Note (Signed)
Delivery Note   Patient Name: Linda Huber DOB: Oct 26, 1991 MRN: [id number]  Date of admission: 07/18/2021 Delivering MD: Dale Rule  Date of delivery: 07/18/21 Type of delivery: SVD  Newborn Data: Live born female  Birth Weight:   APGAR: 9, 9  Newborn Delivery   Birth date/time: 07/18/2021 21:36:00 Delivery type: Vaginal, Spontaneous     [name], 30 y.o., @ [redacted]w[redacted]d,  [id number], who was admitted for spontaneous latent labor,m progressed natural with AROM at 10 cm, clear, after being adequately tx with 2 doses of PCN. I was called to the room when she progressed 2+ station in the second stage of labor.  She pushed for 5/min.  She delivered a viable infant, cephalic and restituted to the ROA position over an intact perineum.  A nuchal cord   was not identified. The baby was placed on maternal abdomen while initial step of NRP were perfmored (Dry, Stimulated, and warmed). Hat placed on baby for thermoregulation. Delayed cord clamping was performed for 2 minutes.  Cord double clamped and cut.  Cord cut by father. Apgar scores were 9 and 9. Prophylactic Pitocin was started in the third stage of labor for active management. The placenta delivered spontaneously, shultz, with a 3 vessel cord and was sent to LD.  Inspection revealed none. An examination of the vaginal vault and cervix was free from lacerations. The uterus was firm, bleeding stable.   Placenta and umbilical artery blood gas were not sent.  There were no complications during the procedure.  Mom and baby skin to skin following delivery. Left in stable condition.  Maternal Info: Anesthesia: None all natural Episiotomy: no Lacerations:  no Suture Repair: no Est. Blood Loss (mL):   Newborn Info:  Baby Sex: female Circumcision: N/A  APGAR (1 MIN): 9   APGAR (5 MINS): 9   APGAR (10 MINS):     Mom to postpartum.  Baby to Couplet care / Skin to Skin.  Dr Su Hilt aware  [place], CNM, NP-C 07/18/21 10:24  PM

## 2021-03-12 ENCOUNTER — Encounter (HOSPITAL_COMMUNITY): Payer: Self-pay | Admitting: Obstetrics & Gynecology

## 2021-03-12 ENCOUNTER — Inpatient Hospital Stay (HOSPITAL_BASED_OUTPATIENT_CLINIC_OR_DEPARTMENT_OTHER): Payer: 59

## 2021-03-12 ENCOUNTER — Other Ambulatory Visit: Payer: Self-pay

## 2021-03-12 ENCOUNTER — Inpatient Hospital Stay (HOSPITAL_COMMUNITY)
Admission: AD | Admit: 2021-03-12 | Discharge: 2021-03-12 | Disposition: A | Discharge status: Home / Self Care | Payer: 59 | Attending: Obstetrics & Gynecology | Admitting: Obstetrics & Gynecology

## 2021-03-12 DIAGNOSIS — Z3A21 21 weeks gestation of pregnancy: Secondary | ICD-10-CM | POA: Diagnosis not present

## 2021-03-12 DIAGNOSIS — O26892 Other specified pregnancy related conditions, second trimester: Secondary | ICD-10-CM | POA: Diagnosis not present

## 2021-03-12 DIAGNOSIS — Y990 Civilian activity done for income or pay: Secondary | ICD-10-CM | POA: Insufficient documentation

## 2021-03-12 DIAGNOSIS — M25562 Pain in left knee: Secondary | ICD-10-CM | POA: Diagnosis not present

## 2021-03-12 DIAGNOSIS — R262 Difficulty in walking, not elsewhere classified: Secondary | ICD-10-CM | POA: Diagnosis not present

## 2021-03-12 DIAGNOSIS — O99891 Other specified diseases and conditions complicating pregnancy: Secondary | ICD-10-CM | POA: Diagnosis not present

## 2021-03-12 DIAGNOSIS — O2222 Superficial thrombophlebitis in pregnancy, second trimester: Secondary | ICD-10-CM | POA: Diagnosis not present

## 2021-03-12 DIAGNOSIS — W109XXA Fall (on) (from) unspecified stairs and steps, initial encounter: Secondary | ICD-10-CM | POA: Diagnosis not present

## 2021-03-12 DIAGNOSIS — O321XX0 Maternal care for breech presentation, not applicable or unspecified: Secondary | ICD-10-CM | POA: Diagnosis not present

## 2021-03-12 DIAGNOSIS — Z79899 Other long term (current) drug therapy: Secondary | ICD-10-CM | POA: Diagnosis not present

## 2021-03-12 DIAGNOSIS — Z0389 Encounter for observation for other suspected diseases and conditions ruled out: Secondary | ICD-10-CM

## 2021-03-12 DIAGNOSIS — Z791 Long term (current) use of non-steroidal anti-inflammatories (NSAID): Secondary | ICD-10-CM | POA: Diagnosis not present

## 2021-03-12 DIAGNOSIS — O9A212 Injury, poisoning and certain other consequences of external causes complicating pregnancy, second trimester: Secondary | ICD-10-CM | POA: Diagnosis present

## 2021-03-12 DIAGNOSIS — W19XXXA Unspecified fall, initial encounter: Secondary | ICD-10-CM

## 2021-03-12 DIAGNOSIS — R109 Unspecified abdominal pain: Secondary | ICD-10-CM | POA: Insufficient documentation

## 2021-03-12 DIAGNOSIS — T1490XA Injury, unspecified, initial encounter: Secondary | ICD-10-CM | POA: Diagnosis not present

## 2021-03-12 DIAGNOSIS — W108XXA Fall (on) (from) other stairs and steps, initial encounter: Secondary | ICD-10-CM

## 2021-03-12 LAB — URINALYSIS, ROUTINE W REFLEX MICROSCOPIC
Bilirubin Urine: NEGATIVE
Glucose, UA: NEGATIVE mg/dL
Hgb urine dipstick: NEGATIVE
Ketones, ur: NEGATIVE mg/dL
Leukocytes,Ua: NEGATIVE
Nitrite: NEGATIVE
Protein, ur: NEGATIVE mg/dL
Specific Gravity, Urine: 1.008 (ref 1.005–1.030)
pH: 7 (ref 5.0–8.0)

## 2021-03-12 NOTE — Discharge Instructions (Signed)
Fall Prevention in the Home, Adult Falls can cause injuries and can affect people from all age groups. There are many simple things that you can do to make your home safe and to help prevent falls. Ask for help when making these changes, if needed. What actions can I take to prevent falls? General instructions  Use good lighting in all rooms. Replace any light bulbs that burn out.  Turn on lights if it is dark. Use night-lights.  Place frequently used items in easy-to-reach places. Lower the shelves around your home if necessary.  Set up furniture so that there are clear paths around it. Avoid moving your furniture around.  Remove throw rugs and other tripping hazards from the floor.  Avoid walking on wet floors.  Fix any uneven floor surfaces.  Add color or contrast paint or tape to grab bars and handrails in your home. Place contrasting color strips on the first and last steps of stairways.  When you use a stepladder, make sure that it is completely opened and that the sides are firmly locked. Have someone hold the ladder while you are using it. Do not climb a closed stepladder.  Be aware of any and all pets. What can I do in the bathroom?  Keep the floor dry. Immediately clean up any water that spills onto the floor.  Remove soap buildup in the tub or shower on a regular basis.  Use non-skid mats or decals on the floor of the tub or shower.  Attach bath mats securely with double-sided, non-slip rug tape.  If you need to sit down while you are in the shower, use a plastic, non-slip stool.  Install grab bars by the toilet and in the tub and shower. Do not use towel bars as grab bars.      What can I do in the bedroom?  Make sure that a bedside light is easy to reach.  Do not use oversized bedding that drapes onto the floor.  Have a firm chair that has side arms to use for getting dressed. What can I do in the kitchen?  Clean up any spills right away.  If you need to  reach for something above you, use a sturdy step stool that has a grab bar.  Keep electrical cables out of the way.  Do not use floor polish or wax that makes floors slippery. If you must use wax, make sure that it is non-skid floor wax. What can I do in the stairways?  Do not leave any items on the stairs.  Make sure that you have a light switch at the top of the stairs and the bottom of the stairs. Have them installed if you do not have them.  Make sure that there are handrails on both sides of the stairs. Fix handrails that are broken or loose. Make sure that handrails are as long as the stairways.  Install non-slip stair treads on all stairs in your home.  Avoid having throw rugs at the top or bottom of stairways, or secure the rugs with carpet tape to prevent them from moving.  Choose a carpet design that does not hide the edge of steps on the stairway.  Check any carpeting to make sure that it is firmly attached to the stairs. Fix any carpet that is loose or worn. What can I do on the outside of my home?  Use bright outdoor lighting.  Regularly repair the edges of walkways and driveways and fix any cracks.  Remove high doorway thresholds.  Trim any shrubbery on the main path into your home.  Regularly check that handrails are securely fastened and in good repair. Both sides of any steps should have handrails.  Install guardrails along the edges of any raised decks or porches.  Clear walkways of debris and clutter, including tools and rocks.  Have leaves, snow, and ice cleared regularly.  Use sand or salt on walkways during winter months.  In the garage, clean up any spills right away, including grease or oil spills. What other actions can I take?  Wear closed-toe shoes that fit well and support your feet. Wear shoes that have rubber soles or low heels.  Use mobility aids as needed, such as canes, walkers, scooters, and crutches.  Review your medicines with your  health care provider. Some medicines can cause dizziness or changes in blood pressure, which increase your risk of falling. Talk with your health care provider about other ways that you can decrease your risk of falls. This may include working with a physical therapist or trainer to improve your strength, balance, and endurance. Where to find more information  Centers for Disease Control and Prevention, STEADI: TVDivision.uy  [employer] on Aging: RingConnections.si Contact a health care provider if:  You are afraid of falling at home.  You feel weak, drowsy, or dizzy at home.  You fall at home. Summary  There are many simple things that you can do to make your home safe and to help prevent falls.  Ways to make your home safe include removing tripping hazards and installing grab bars in the bathroom.  Ask for help when making these changes in your home. This information is not intended to replace advice given to you by your health care provider. Make sure you discuss any questions you have with your health care provider. Document Revised: 11/12/2017 Document Reviewed: 07/15/2017 Elsevier Patient Education  2021 Elsevier Inc.  Second Trimester of Pregnancy  The second trimester of pregnancy is from week 13 through week 27. This is also called months 4 through 6 of pregnancy. This is often the time when you feel your best. During the second trimester:  Morning sickness is less or has stopped.  You may have more energy.  You may feel hungry more often. At this time, your unborn baby (fetus) is growing very fast. At the end of the sixth month, the unborn baby may be up to 12 inches long and weigh about 1 pounds. You will likely start to feel the baby move between 16 and 20 weeks of pregnancy. Body changes during your second trimester Your body continues to go through many changes during this time. The changes vary and generally return to normal after the baby  is born. Physical changes  You will gain more weight.  You may start to get stretch marks on your hips, belly (abdomen), and breasts.  Your breasts will grow and may hurt.  Dark spots or blotches may develop on your face.  A dark line from your belly button to the pubic area (linea nigra) may appear.  You may have changes in your hair. Health changes  You may have headaches.  You may have heartburn.  You may have trouble pooping (constipation).  You may have hemorrhoids or swollen, bulging veins (varicose veins).  Your gums may bleed.  You may pee (urinate) more often.  You may have back pain. Follow these instructions at home: Medicines  Take over-the-counter and prescription medicines only as  told by your doctor. Some medicines are not safe during pregnancy.  Take a prenatal vitamin that contains at least 600 micrograms (mcg) of folic acid. Eating and drinking  Eat healthy meals that include: ? Fresh fruits and vegetables. ? Whole grains. ? Good sources of protein, such as meat, eggs, or tofu. ? Low-fat dairy products.  Avoid raw meat and unpasteurized juice, milk, and cheese.  You may need to take these actions to prevent or treat trouble pooping: ? Drink enough fluids to keep your pee (urine) pale yellow. ? Eat foods that are high in fiber. These include beans, whole grains, and fresh fruits and vegetables. ? Limit foods that are high in fat and sugar. These include fried or sweet foods. Activity  Exercise only as told by your doctor. Most people can do their usual exercise during pregnancy. Try to exercise for 30 minutes at least 5 days a week.  Stop exercising if you have pain or cramps in your belly or lower back.  Do not exercise if it is too hot or too humid, or if you are in a place of great height (high altitude).  Avoid heavy lifting.  If you choose to, you may have sex unless your doctor tells you not to. Relieving pain and discomfort  Wear a  good support bra if your breasts are sore.  Take warm water baths (sitz baths) to soothe pain or discomfort caused by hemorrhoids. Use hemorrhoid cream if your doctor approves.  Rest with your legs raised (elevated) if you have leg cramps or low back pain.  If you develop bulging veins in your legs: ? Wear support hose as told by your doctor. ? Raise your feet for 15 minutes, 3-4 times a day. ? Limit salt in your food. Safety  Wear your seat belt at all times when you are in a car.  Talk with your doctor if someone is hurting you or yelling at you a lot. Lifestyle  Do not use hot tubs, steam rooms, or saunas.  Do not douche. Do not use tampons or scented sanitary pads.  Avoid cat litter boxes and soil used by cats. These carry germs that can harm your baby and can cause a loss of your baby by miscarriage or stillbirth.  Do not use herbal medicines, illegal drugs, or medicines that are not approved by your doctor. Do not drink alcohol.  Do not smoke or use any products that contain nicotine or tobacco. If you need help quitting, ask your doctor. General instructions  Keep all follow-up visits. This is important.  Ask your doctor about local prenatal classes.  Ask your doctor about the right foods to eat or for help finding a counselor. Where to find more information  American Pregnancy Association: americanpregnancy.org  [employer] of [employer]stetricians and Gynecologists: www.acog.org  Office on [employer] Health: MightyReward.co.nz Contact a doctor if:  You have a headache that does not go away when you take medicine.  You have changes in how you see, or you see spots in front of your eyes.  You have mild cramps, pressure, or pain in your lower belly.  You continue to feel like you may vomit (nauseous), you vomit, or you have watery poop (diarrhea).  You have bad-smelling fluid coming from your vagina.  You have pain when you pee or your pee smells  bad.  You have very bad swelling of your face, hands, ankles, feet, or legs.  You have a fever. Get help right away if:  You are leaking fluid from your vagina.  You have spotting or bleeding from your vagina.  You have very bad belly cramping or pain.  You have trouble breathing.  You have chest pain.  You faint.  You have not felt your baby move for the time period told by your doctor.  You have new or increased pain, swelling, or redness in an arm or leg. Summary  The second trimester of pregnancy is from week 13 through week 27 (months 4 through 6).  Eat healthy meals.  Exercise as told by your doctor. Most people can do their usual exercise during pregnancy.  Do not use herbal medicines, illegal drugs, or medicines that are not approved by your doctor. Do not drink alcohol.  Call your doctor if you get sick or if you notice anything unusual about your pregnancy. This information is not intended to replace advice given to you by your health care provider. Make sure you discuss any questions you have with your health care provider. Document Revised: 05/08/2020 Document Reviewed: 03/14/2020 Elsevier Patient Education  2021 [employer].

## 2021-03-12 NOTE — MAU Provider Note (Incomplete Revision)
Chief Complaint: Fall   Event Date/Time   First Provider Initiated Contact with Patient 03/12/21 2029      SUBJECTIVE HPI: Linda Huber is a 30 y.o. [id number] at [redacted]w[redacted]d by LMP who presents to maternity admissions reporting she fell yesterday while carrying a box at work. She reports pain and swelling in her left knee x 2 months causing numbness and weakness of the leg. Yesterday her left leg gave out under her and she fell down a few stairs. She reports she did hit her abdomen and left side.  She had some cramping yesterday that improved so she did not come in. Today she was at work and had more abdominal cramping so she called her OB office at [facility] and was sent to MAU for further evaluation. She denies any bleeding. She is feeling fetal movement.     Location: lower abdomen Quality: cramping Severity: 8/10 on pain scale Duration: 1 day Timing: intermittent Modifying factors: pain started after fall Associated signs and symptoms: left leg edema and pain  HPI  Past Medical History:  Diagnosis Date  . Depression    not on meds, doing good  . Eczema   . Infection    UTI  . Urinary tract infection    Past Surgical History:  Procedure Laterality Date  . BIOPSY BREAST    . WISDOM TOOTH EXTRACTION     Social History   Socioeconomic History  . Marital status: Married    Spouse name: Not on file  . Number of children: 3  . Years of education: Not on file  . Highest education level: Not on file  Occupational History  . Not on file  Tobacco Use  . Smoking status: Never Smoker  . Smokeless tobacco: Never Used  Vaping Use  . Vaping Use: Never used  Substance and Sexual Activity  . Alcohol use: Yes    Comment: not with + preg  . Drug use: No  . Sexual activity: Yes    Birth control/protection: None  Other Topics Concern  . Not on file  Social History Narrative  . Not on file   Social Determinants of Health   Financial Resource Strain: Not on file  Food Insecurity: Not  on file  Transportation Needs: Not on file  Physical Activity: Not on file  Stress: Not on file  Social Connections: Not on file  Intimate Partner Violence: Not on file   No current facility-administered medications on file prior to encounter.   Current Outpatient Medications on File Prior to Encounter  Medication Sig Dispense Refill  . EPINEPHrine 0.3 mg/0.3 mL IJ SOAJ injection INJECT 0.3 MLS (0.3 MG TOTAL) INTO THE MUSCLE ONCE FOR 1 DOSE.    . fluconazole (DIFLUCAN) 150 MG tablet fluconazole 150 mg tablet  1 TABLET ORALLY ONCE THEN MAY REPEAT IN 3 DAYS IF NEEDED FOR VAGINAL YEAST SYMPTOMS.    Marland Kitchen ibuprofen (ADVIL) 800 MG tablet Take 1 tablet (800 mg total) by mouth 3 (three) times daily. 90 tablet 1  . metroNIDAZOLE (METROGEL VAGINAL) 0.75 % vaginal gel Metrogel Vaginal 0.75 %  Insert 1 applicatorful twice a week by vaginal route at bedtime for 90 days.    . Minoxidil (ROGAINE MENS EXTRA STRENGTH) 5 % FOAM Rogaine 5 % topical foam  HALF A CUPFUL TO HAIR AS DIRECTED IN INSERT ONCE A DAY    . omeprazole (PRILOSEC) 20 MG capsule Take 1 capsule (20 mg total) by mouth 2 (two) times daily before a meal. 14  capsule 0  . oxyCODONE-acetaminophen (PERCOCET) 5-325 MG tablet Take 1 tablet by mouth every 6 (six) hours as needed for severe pain. 30 tablet 0  . Secnidazole (SOLOSEC) 2 g PACK Solosec 2 gram oral DR granules in packet  TAKE 1 PACKET EVERY DAY BY ORAL ROUTE FOR 1 DAY.     No Known Allergies  ROS:  Review of Systems  Constitutional: Negative for chills, fatigue and fever.  Respiratory: Negative for shortness of breath.   Cardiovascular: Positive for leg swelling. Negative for chest pain.  Gastrointestinal: Positive for abdominal pain. Negative for nausea and vomiting.  Genitourinary: Negative for difficulty urinating, dysuria, flank pain, pelvic pain, vaginal bleeding, vaginal discharge and vaginal pain.  Musculoskeletal: Positive for joint swelling.  Skin: Positive for color change.        Darkening of skin behind left knee when pt standing  Neurological: Negative for dizziness and headaches.  Psychiatric/Behavioral: Negative.      I have reviewed patient's Past Medical Hx, Surgical Hx, Family Hx, Social Hx, medications and allergies.   Physical Exam   Patient Vitals for the past 24 hrs:  BP Temp Pulse Resp Height Weight  03/12/21 1931 109/74 - 88 - - -  03/12/21 1921 - 98.2 F (36.8 C) - 16 5\' 5"  (1.651 m) 63.5 kg   Constitutional: Well-developed, well-nourished female in no acute distress.  Cardiovascular: normal rate Respiratory: normal effort GI: Abd soft, non-tender. Pos BS x 4 MS: Left leg with significant varicosities behind left knee, edema and warmth of varicosities and surrounding tissue when pt standing Neurologic: Alert and oriented x 4.  GU: Neg CVAT.  FHT 160 by doppler  LAB RESULTS Results for orders placed or performed during the hospital encounter of 03/12/21 (from the past 24 hour(s))  Urinalysis, Routine w reflex microscopic Urine, Clean Catch     Status: Abnormal   Collection Time: 03/12/21  7:31 PM  Result Value Ref Range   Color, Urine STRAW (A) YELLOW   APPearance CLEAR CLEAR   Specific Gravity, Urine 1.008 1.005 - 1.030   pH 7.0 5.0 - 8.0   Glucose, UA NEGATIVE NEGATIVE mg/dL   Hgb urine dipstick NEGATIVE NEGATIVE   Bilirubin Urine NEGATIVE NEGATIVE   Ketones, ur NEGATIVE NEGATIVE mg/dL   Protein, ur NEGATIVE NEGATIVE mg/dL   Nitrite NEGATIVE NEGATIVE   Leukocytes,Ua NEGATIVE NEGATIVE       IMAGING No results found.  MAU Management/MDM: Orders Placed This Encounter  Procedures  . 03/14/21 MFM OB Limited  . Urinalysis, Routine w reflex microscopic Urine, Clean Catch    No orders of the defined types were placed in this encounter.   FHT wnl today but given abdominal cramping and pt report of hitting her abdomen in the fall yesterday, MFM limited OB US ordered.  Pain and other symptoms in left leg c/w superficial  thrombophlebitis. No s/sx of DVT.  Consult Dr [place], family practice physician for evaluation of knee pain/edema.  Compression stockings ordered and given to pt in MAU today.  Pt to follow up with primary care as soon as possible for further evaluation.    Report to Anner Crete, CNM, with Wynelle Bourgeois results pending.    [place] Certified Nurse-Midwife 03/12/2021  8:31 PM

## 2021-03-12 NOTE — MAU Provider Note (Addendum)
Chief Complaint: Fall   Event Date/Time   First Provider Initiated Contact with Patient 03/12/21 2029      SUBJECTIVE HPI: Linda Huber is a 30 y.o. [id number] at [redacted]w[redacted]d by LMP who presents to maternity admissions reporting she fell yesterday while carrying a box at work. She reports pain and swelling in her left knee x 2 months causing numbness and weakness of the leg. Yesterday her left leg gave out under her and she fell down a few stairs. She reports she did hit her abdomen and left side.  She had some cramping yesterday that improved so she did not come in. Today she was at work and had more abdominal cramping so she called her OB office at [facility] and was sent to MAU for further evaluation. She denies any bleeding. She is feeling fetal movement.     Location: lower abdomen Quality: cramping Severity: 8/10 on pain scale Duration: 1 day Timing: intermittent Modifying factors: pain started after fall Associated signs and symptoms: left leg edema and pain  HPI  Past Medical History:  Diagnosis Date  . Depression    not on meds, doing good  . Eczema   . Infection    UTI  . Urinary tract infection    Past Surgical History:  Procedure Laterality Date  . BIOPSY BREAST    . WISDOM TOOTH EXTRACTION     Social History   Socioeconomic History  . Marital status: Married    Spouse name: Not on file  . Number of children: 3  . Years of education: Not on file  . Highest education level: Not on file  Occupational History  . Not on file  Tobacco Use  . Smoking status: Never Smoker  . Smokeless tobacco: Never Used  Vaping Use  . Vaping Use: Never used  Substance and Sexual Activity  . Alcohol use: Yes    Comment: not with + preg  . Drug use: No  . Sexual activity: Yes    Birth control/protection: None  Other Topics Concern  . Not on file  Social History Narrative  . Not on file   Social Determinants of Health   Financial Resource Strain: Not on file  Food Insecurity: Not  on file  Transportation Needs: Not on file  Physical Activity: Not on file  Stress: Not on file  Social Connections: Not on file  Intimate Partner Violence: Not on file   No current facility-administered medications on file prior to encounter.   Current Outpatient Medications on File Prior to Encounter  Medication Sig Dispense Refill  . EPINEPHrine 0.3 mg/0.3 mL IJ SOAJ injection INJECT 0.3 MLS (0.3 MG TOTAL) INTO THE MUSCLE ONCE FOR 1 DOSE.    . fluconazole (DIFLUCAN) 150 MG tablet fluconazole 150 mg tablet  1 TABLET ORALLY ONCE THEN MAY REPEAT IN 3 DAYS IF NEEDED FOR VAGINAL YEAST SYMPTOMS.    Marland Kitchen ibuprofen (ADVIL) 800 MG tablet Take 1 tablet (800 mg total) by mouth 3 (three) times daily. 90 tablet 1  . metroNIDAZOLE (METROGEL VAGINAL) 0.75 % vaginal gel Metrogel Vaginal 0.75 %  Insert 1 applicatorful twice a week by vaginal route at bedtime for 90 days.    . Minoxidil (ROGAINE MENS EXTRA STRENGTH) 5 % FOAM Rogaine 5 % topical foam  HALF A CUPFUL TO HAIR AS DIRECTED IN INSERT ONCE A DAY    . omeprazole (PRILOSEC) 20 MG capsule Take 1 capsule (20 mg total) by mouth 2 (two) times daily before a meal. 14  capsule 0  . oxyCODONE-acetaminophen (PERCOCET) 5-325 MG tablet Take 1 tablet by mouth every 6 (six) hours as needed for severe pain. 30 tablet 0  . Secnidazole (SOLOSEC) 2 g PACK Solosec 2 gram oral DR granules in packet  TAKE 1 PACKET EVERY DAY BY ORAL ROUTE FOR 1 DAY.     No Known Allergies  ROS:  Review of Systems  Constitutional: Negative for chills, fatigue and fever.  Respiratory: Negative for shortness of breath.   Cardiovascular: Positive for leg swelling. Negative for chest pain.  Gastrointestinal: Positive for abdominal pain. Negative for nausea and vomiting.  Genitourinary: Negative for difficulty urinating, dysuria, flank pain, pelvic pain, vaginal bleeding, vaginal discharge and vaginal pain.  Musculoskeletal: Positive for joint swelling.  Skin: Positive for color change.        Darkening of skin behind left knee when pt standing  Neurological: Negative for dizziness and headaches.  Psychiatric/Behavioral: Negative.      I have reviewed patient's Past Medical Hx, Surgical Hx, Family Hx, Social Hx, medications and allergies.   Physical Exam   Patient Vitals for the past 24 hrs:  BP Temp Pulse Resp Height Weight  03/12/21 1931 109/74 -- 88 -- -- --  03/12/21 1921 -- 98.2 F (36.8 C) -- 16 5\' 5"  (1.651 m) 63.5 kg   Constitutional: Well-developed, well-nourished female in no acute distress.  Cardiovascular: normal rate Respiratory: normal effort GI: Abd soft, non-tender. Pos BS x 4 MS: Left leg with significant varicosities behind left knee, edema and warmth of varicosities and surrounding tissue when pt standing Neurologic: Alert and oriented x 4.  GU: Neg CVAT.  FHT 160 by doppler  LAB RESULTS Results for orders placed or performed during the hospital encounter of 03/12/21 (from the past 24 hour(s))  Urinalysis, Routine w reflex microscopic Urine, Clean Catch     Status: Abnormal   Collection Time: 03/12/21  7:31 PM  Result Value Ref Range   Color, Urine STRAW (A) YELLOW   APPearance CLEAR CLEAR   Specific Gravity, Urine 1.008 1.005 - 1.030   pH 7.0 5.0 - 8.0   Glucose, UA NEGATIVE NEGATIVE mg/dL   Hgb urine dipstick NEGATIVE NEGATIVE   Bilirubin Urine NEGATIVE NEGATIVE   Ketones, ur NEGATIVE NEGATIVE mg/dL   Protein, ur NEGATIVE NEGATIVE mg/dL   Nitrite NEGATIVE NEGATIVE   Leukocytes,Ua NEGATIVE NEGATIVE       IMAGING No results found.  MAU Management/MDM: Orders Placed This Encounter  Procedures  . 03/14/21 MFM OB Limited  . Urinalysis, Routine w reflex microscopic Urine, Clean Catch    No orders of the defined types were placed in this encounter.   FHT wnl today but given abdominal cramping and pt report of hitting her abdomen in the fall yesterday, MFM limited OB US ordered.  Pain and other symptoms in left leg c/w superficial  thrombophlebitis. No s/sx of DVT.  Consult Dr [place], family practice physician for evaluation of knee pain/edema.  Compression stockings ordered and given to pt in MAU today.  Pt to follow up with primary care as soon as possible for further evaluation.    Report to Anner Crete, CNM, with Wynelle Bourgeois results pending.    [place] Certified Nurse-Midwife 03/12/2021  8:31 PM  03/14/2021 MFM OB Limited  Result Date: 03/12/2021 ----------------------------------------------------------------------  OBSTETRICS REPORT                         (Signed Final 03/12/2021 09:40 pm) ---------------------------------------------------------------------- Patient  Info  ID #:        [id number]                          D.O.B.:  1991/09/30 (29 yrs)  Name:        JARICA PLASS                   Visit Date: 03/12/2021 08:51 pm ---------------------------------------------------------------------- Performed By  Attending:         Noralee Space MD        Referred By:       [facility] MAU/Triage  Performed By:      Hurman Horn RDMS     [facility] ---------------------------------------------------------------------- Orders  #   Description                          Code         Ordered By  1   [place] MFM OB LIMITED                    [id number]     LISA LEFTWICH-KIRBY ----------------------------------------------------------------------  #   Order #                    Accession #                 Episode #  1   [id number]                  [id number]                  [id number] ---------------------------------------------------------------------- Indications  [redacted] weeks gestation of pregnancy                 Z3A.21  Traumatic injury during pregnancy (fell down    O9A.219 T14.90  stairs yesterday) ---------------------------------------------------------------------- Fetal Evaluation  Num Of Fetuses:          1  Fetal Heart Rate(bpm):   145  Cardiac  Activity:        Observed  Presentation:            Breech  Placenta:                Posterior  P. Cord Insertion:       Visualized, central  Amniotic Fluid  AFI FV:      Within normal limits                              Largest Pocket(cm)                              3.9  Comment:     Stomach, bladder, and diaphragm noted. No placental abruption or               previa identified. ---------------------------------------------------------------------- OB History  Gravidity:  5         Term:  3          SAB:   1 ---------------------------------------------------------------------- Gestational Age  Clinical EDD:   21w 3d                                         EDD:  07/20/21  Best:           21w 3d    Det. By:  Clinical EDD               EDD:  07/20/21 ---------------------------------------------------------------------- Cervix Uterus Adnexa  Cervix  Length:            4.58  cm.  Normal appearance by transabdominal scan.  Uterus  No abnormality visualized.  Right Ovary  Within normal limits.  Left Ovary  Within normal limits.  Adnexa  No abnormality visualized. ---------------------------------------------------------------------- Impression  History of fall on her abdomen. No history of vaginal bleeding.  A limited ultrasound study was performed .Amniotic fluid is  normal and good fetal activity is seen. Placenta appears normal  with no evidence of retroplacental hemorrhage.  On transabdominal scan, the cervix looks long and closed . ----------------------------------------------------------------------                  Noralee Spaceavi Shankar, MD Electronically Signed Final Report   03/12/2021 09:40 pm ----------------------------------------------------------------------  Patient notified of reassuring results of US Discharged home.   Had been given support hose to wear at home Will self refer to vein specialist or have her OB refer Encouraged to return if she develops worsening of symptoms, increase in pain,  fever, or other concerning symptoms.    Aviva SignsWilliams, Kebra Lowrimore L, CNM

## 2021-03-12 NOTE — MAU Note (Signed)
Yesterday around 1500 I fell down some stairs at work. I tumbled down about 5 stairs.  Had some cramping afterward. Decided to wait until today to see if cramping stopped but has continued today. Denies VB or LOF. Today FM has been normal

## 2021-06-30 DIAGNOSIS — Z22330 Carrier of Group B streptococcus: Secondary | ICD-10-CM | POA: Insufficient documentation

## 2021-06-30 LAB — OB RESULTS CONSOLE GBS: GBS: NEGATIVE

## 2021-07-18 ENCOUNTER — Other Ambulatory Visit: Payer: Self-pay

## 2021-07-18 ENCOUNTER — Inpatient Hospital Stay (HOSPITAL_COMMUNITY)
Admission: AD | Admit: 2021-07-18 | Discharge: 2021-07-19 | DRG: 807 | Disposition: A | Discharge status: Home / Self Care | Payer: Managed Care, Other (non HMO) | Attending: Obstetrics and Gynecology | Admitting: Obstetrics and Gynecology

## 2021-07-18 ENCOUNTER — Encounter (HOSPITAL_COMMUNITY): Payer: Self-pay | Admitting: Obstetrics and Gynecology

## 2021-07-18 DIAGNOSIS — O26893 Other specified pregnancy related conditions, third trimester: Secondary | ICD-10-CM | POA: Diagnosis present

## 2021-07-18 DIAGNOSIS — O99824 Streptococcus B carrier state complicating childbirth: Secondary | ICD-10-CM | POA: Diagnosis present

## 2021-07-18 DIAGNOSIS — O99344 Other mental disorders complicating childbirth: Secondary | ICD-10-CM | POA: Diagnosis present

## 2021-07-18 DIAGNOSIS — Z20822 Contact with and (suspected) exposure to covid-19: Secondary | ICD-10-CM | POA: Diagnosis present

## 2021-07-18 DIAGNOSIS — Z3A39 39 weeks gestation of pregnancy: Secondary | ICD-10-CM

## 2021-07-18 DIAGNOSIS — F32A Depression, unspecified: Secondary | ICD-10-CM | POA: Diagnosis present

## 2021-07-18 DIAGNOSIS — O9902 Anemia complicating childbirth: Principal | ICD-10-CM | POA: Diagnosis present

## 2021-07-18 DIAGNOSIS — D573 Sickle-cell trait: Secondary | ICD-10-CM | POA: Diagnosis present

## 2021-07-18 LAB — RESP PANEL BY RT-PCR (FLU A&B, COVID) ARPGX2
Influenza A by PCR: NEGATIVE
Influenza B by PCR: NEGATIVE
SARS Coronavirus 2 by RT PCR: NEGATIVE

## 2021-07-18 LAB — CBC
HCT: 37.9 % (ref 36.0–46.0)
Hemoglobin: 12.4 g/dL (ref 12.0–15.0)
MCH: 27.4 pg (ref 26.0–34.0)
MCHC: 32.7 g/dL (ref 30.0–36.0)
MCV: 83.7 fL (ref 80.0–100.0)
Platelets: 235 10*3/uL (ref 150–400)
RBC: 4.53 MIL/uL (ref 3.87–5.11)
RDW: 13.4 % (ref 11.5–15.5)
WBC: 12.3 10*3/uL — ABNORMAL HIGH (ref 4.0–10.5)
nRBC: 0 % (ref 0.0–0.2)

## 2021-07-18 MED ORDER — LACTATED RINGERS IV SOLN: INTRAVENOUS | Status: DC

## 2021-07-18 MED ORDER — ACETAMINOPHEN 325 MG PO TABS: 650.0000 mg | ORAL_TABLET | ORAL | Status: DC | PRN

## 2021-07-18 MED ORDER — ONDANSETRON HCL 4 MG/2ML IJ SOLN: 4.0000 mg | Freq: Four times a day (QID) | INTRAMUSCULAR | Status: DC | PRN

## 2021-07-18 MED ORDER — IBUPROFEN 600 MG PO TABS
600.0000 mg | ORAL_TABLET | Freq: Four times a day (QID) | ORAL | Status: DC
  Administered 2021-07-18 – 2021-07-19 (×3): 600 mg via ORAL
  Filled 2021-07-18 (×3): qty 1

## 2021-07-18 MED ORDER — LIDOCAINE HCL (PF) 1 % IJ SOLN: 30.0000 mL | INTRAMUSCULAR | Status: DC | PRN

## 2021-07-18 MED ORDER — OXYTOCIN-SODIUM CHLORIDE 30-0.9 UT/500ML-% IV SOLN
2.5000 [IU]/h | INTRAVENOUS | Status: DC
  Filled 2021-07-18: qty 500

## 2021-07-18 MED ORDER — WITCH HAZEL-GLYCERIN EX PADS: 1.0000 "application " | MEDICATED_PAD | CUTANEOUS | Status: DC | PRN

## 2021-07-18 MED ORDER — SERTRALINE HCL 50 MG PO TABS
50.0000 mg | ORAL_TABLET | Freq: Every day | ORAL | Status: DC
  Administered 2021-07-19: 50 mg via ORAL
  Filled 2021-07-18: qty 1

## 2021-07-18 MED ORDER — SODIUM CHLORIDE 0.9 % IV SOLN
5.0000 10*6.[IU] | Freq: Once | INTRAVENOUS | Status: AC
  Administered 2021-07-18: 5 10*6.[IU] via INTRAVENOUS
  Filled 2021-07-18: qty 5

## 2021-07-18 MED ORDER — ACETAMINOPHEN 325 MG PO TABS
650.0000 mg | ORAL_TABLET | ORAL | Status: DC | PRN
  Administered 2021-07-18: 650 mg via ORAL
  Filled 2021-07-18: qty 2

## 2021-07-18 MED ORDER — OXYTOCIN BOLUS FROM INFUSION
333.0000 mL | Freq: Once | INTRAVENOUS | Status: AC
  Administered 2021-07-18: 333 mL via INTRAVENOUS

## 2021-07-18 MED ORDER — DIPHENHYDRAMINE HCL 25 MG PO CAPS: 25.0000 mg | ORAL_CAPSULE | Freq: Four times a day (QID) | ORAL | Status: DC | PRN

## 2021-07-18 MED ORDER — OXYCODONE-ACETAMINOPHEN 5-325 MG PO TABS: 2.0000 | ORAL_TABLET | ORAL | Status: DC | PRN

## 2021-07-18 MED ORDER — SENNOSIDES-DOCUSATE SODIUM 8.6-50 MG PO TABS
2.0000 | ORAL_TABLET | Freq: Every day | ORAL | Status: DC
  Filled 2021-07-18: qty 2

## 2021-07-18 MED ORDER — FENTANYL CITRATE (PF) 100 MCG/2ML IJ SOLN: 50.0000 ug | INTRAMUSCULAR | Status: DC | PRN

## 2021-07-18 MED ORDER — TETANUS-DIPHTH-ACELL PERTUSSIS 5-2.5-18.5 LF-MCG/0.5 IM SUSY: 0.5000 mL | PREFILLED_SYRINGE | Freq: Once | INTRAMUSCULAR | Status: DC

## 2021-07-18 MED ORDER — SIMETHICONE 80 MG PO CHEW: 80.0000 mg | CHEWABLE_TABLET | ORAL | Status: DC | PRN

## 2021-07-18 MED ORDER — BENZOCAINE-MENTHOL 20-0.5 % EX AERO
1.0000 "application " | INHALATION_SPRAY | CUTANEOUS | Status: DC | PRN
  Administered 2021-07-18: 1 via TOPICAL
  Filled 2021-07-18: qty 56

## 2021-07-18 MED ORDER — PRENATAL MULTIVITAMIN CH
1.0000 | ORAL_TABLET | Freq: Every day | ORAL | Status: DC
  Administered 2021-07-19: 1 via ORAL
  Filled 2021-07-18: qty 1

## 2021-07-18 MED ORDER — HYDROXYZINE HCL 50 MG PO TABS: 50.0000 mg | ORAL_TABLET | Freq: Four times a day (QID) | ORAL | Status: DC | PRN

## 2021-07-18 MED ORDER — COCONUT OIL OIL: 1.0000 "application " | TOPICAL_OIL | Status: DC | PRN

## 2021-07-18 MED ORDER — PENICILLIN G POT IN DEXTROSE 60000 UNIT/ML IV SOLN
3.0000 10*6.[IU] | INTRAVENOUS | Status: DC
  Administered 2021-07-18: 3 10*6.[IU] via INTRAVENOUS
  Filled 2021-07-18 (×2): qty 50

## 2021-07-18 MED ORDER — SOD CITRATE-CITRIC ACID 500-334 MG/5ML PO SOLN: 30.0000 mL | ORAL | Status: DC | PRN

## 2021-07-18 MED ORDER — DIBUCAINE (PERIANAL) 1 % EX OINT: 1.0000 "application " | TOPICAL_OINTMENT | CUTANEOUS | Status: DC | PRN

## 2021-07-18 MED ORDER — OXYCODONE-ACETAMINOPHEN 5-325 MG PO TABS
1.0000 | ORAL_TABLET | ORAL | Status: DC | PRN
Start: 2021-07-18 — End: 2021-07-18

## 2021-07-18 MED ORDER — LACTATED RINGERS IV SOLN: 500.0000 mL | INTRAVENOUS | Status: DC | PRN

## 2021-07-18 MED ORDER — ZOLPIDEM TARTRATE 5 MG PO TABS: 5.0000 mg | ORAL_TABLET | Freq: Every evening | ORAL | Status: DC | PRN

## 2021-07-18 MED ORDER — ONDANSETRON HCL 4 MG/2ML IJ SOLN: 4.0000 mg | INTRAMUSCULAR | Status: DC | PRN

## 2021-07-18 MED ORDER — ONDANSETRON HCL 4 MG PO TABS: 4.0000 mg | ORAL_TABLET | ORAL | Status: DC | PRN

## 2021-07-18 NOTE — Progress Notes (Signed)
Labor Progress Note  Linda Huber is a 30 y.o. female, G5P3013, IUP at 39.1 weeks, presenting for spontaneous latent labor at 4.5cm dilated, intact. H/O depression and PPD started on zoloft 50mg , mood stable. GBS+. Sickle cell trait.  Subjective: Pt stable in birthing ball, and supportive partner and mother at bedside, pt desires all natural delivery with low to no interventions. Pt progressing in latent to active labor now.  Patient Active Problem List   Diagnosis Date Noted   Normal labor and delivery 07/18/2021   Suspected COVID-19 virus infection 06/01/2019   Ganglion of right wrist 01/12/2019   Seasonal allergies 04/13/2018   Anemia 02/19/2017   Chronic constipation 02/19/2017   Sickle cell trait (HCC) 11/09/2014   Depression, recurrent (HCC) 11/09/2014   Objective: BP 117/65   Pulse 84   Temp 98.1 F (36.7 C) (Oral)   Resp 18   Ht 5\' 5"  (1.651 m)   Wt 68.4 kg   SpO2 99%   BMI 25.08 kg/m  No intake/output data recorded. No intake/output data recorded. NST: FHR baseline 135 bpm, Variability: moderate, Accelerations:present, Decelerations:  Absent= Cat 1/Reactive CTX:  irregular, every 2-4 minutes, lasting 50-70 seconds  Uterus gravid, soft non tender, moderate to palpate with contractions.  SVE:  Dilation: 5.5 Effacement (%): 90 Station: 0 Exam by:: J. Asier Desroches, CNM Pitocin at N/A declined mUn/min  Assessment:  Linda Huber is a 30 y.o. female, G5P3013, IUP at 39.1 weeks, presenting for spontaneous latent labor at 4.5cm dilated, intact. H/O depression and PPD started on zoloft 50mg , mood stable. GBS+. Sickle cell trait. Pt progressing natural in latent to active labor now.  Patient Active Problem List   Diagnosis Date Noted   Normal labor and delivery 07/18/2021   Suspected COVID-19 virus infection 06/01/2019   Ganglion of right wrist 01/12/2019   Seasonal allergies 04/13/2018   Anemia 02/19/2017   Chronic constipation 02/19/2017   Sickle cell trait (HCC)  11/09/2014   Depression, recurrent (HCC) 11/09/2014   NICHD: Category 1  Membranes: Intact, no s/s of infection  Induction:    Cytotec xN/A cervix ripe  Foley Bulb: N/A  Pitocin - Declined  Pain management:               IV pain management: x PRN  Nitrous: PRN             Epidural placement: PRN  GBS Positive  Abx: PCN at 1639   Plan: Continue labor plan Continuous monitoring Rest Ambulate GBS: Next dose will be adequate at 0830pm today Frequent position changes to facilitate fetal rotation and descent. Will reassess with cervical exam upon pt request or earlier if necessary Continue with expectant management Anticipate labor progression and vaginal delivery.    04/21/2017, NP-C, CNM, MSN 07/18/2021. 6:33 PM

## 2021-07-18 NOTE — Lactation Note (Signed)
This note was copied from a baby's chart. Lactation Consultation Note  Patient Name: Linda Huber MWNUU'V Date: 07/18/2021   Age:30 hours P4, per RN [name]) mom declined [facility] on L&D, infant is latching well and would like to be seen later on MBU.  Maternal Data    Feeding    LATCH Score Latch: Grasps breast easily, tongue down, lips flanged, rhythmical sucking.  Audible Swallowing: Spontaneous and intermittent  Type of Nipple: Everted at rest and after stimulation  Comfort (Breast/Nipple): Soft / non-tender  Hold (Positioning): No assistance needed to correctly position infant at breast.  LATCH Score: 10   Lactation Tools Discussed/Used    Interventions    Discharge    Consult Status      [name] 07/18/2021, 11:05 PM

## 2021-07-18 NOTE — MAU Note (Signed)
Contractions since 0330.  Getting stronger and closer, now every 4 minutes. No bleeding or leaking. Was closed on Monday. No complications with preg, prior vag deliveries at term

## 2021-07-18 NOTE — H&P (Signed)
Linda Huber is a 30 y.o. female, G5P3013, IUP at 39.1 weeks, presenting for spontaneous latent labor at 4.5cm dilated, intact. H/O depression and PPD started on zoloft 50mg , mood stable. GBS+. Sickle cell trait. Pt endorse + Fm. Denies vaginal leakage. Denies vaginal bleeding.   Patient Active Problem List   Diagnosis Date Noted   Suspected COVID-19 virus infection 06/01/2019   Ganglion of right wrist 01/12/2019   Seasonal allergies 04/13/2018   Anemia 02/19/2017   Chronic constipation 02/19/2017   Sickle cell trait (HCC) 11/09/2014   Depression, recurrent (HCC) 11/09/2014     Active Ambulatory Problems    Diagnosis Date Noted   Sickle cell trait (HCC) 11/09/2014   Depression, recurrent (HCC) 11/09/2014   Anemia 02/19/2017   Chronic constipation 02/19/2017   Seasonal allergies 04/13/2018   Ganglion of right wrist 01/12/2019   Suspected COVID-19 virus infection 06/01/2019   Resolved Ambulatory Problems    Diagnosis Date Noted   Normal labor 11/08/2014   Vaginal delivery 08/12/2016   Past Medical History:  Diagnosis Date   Depression    Eczema    Infection    Urinary tract infection       Medications Prior to Admission  Medication Sig Dispense Refill Last Dose   Prenatal Vit-Fe Fumarate-FA (MULTIVITAMIN-PRENATAL) 27-0.8 MG TABS tablet Take 1 tablet by mouth daily at 12 noon.   07/17/2021   EPINEPHrine 0.3 mg/0.3 mL IJ SOAJ injection INJECT 0.3 MLS (0.3 MG TOTAL) INTO THE MUSCLE ONCE FOR 1 DOSE.      fluconazole (DIFLUCAN) 150 MG tablet fluconazole 150 mg tablet  1 TABLET ORALLY ONCE THEN MAY REPEAT IN 3 DAYS IF NEEDED FOR VAGINAL YEAST SYMPTOMS.      ibuprofen (ADVIL) 800 MG tablet Take 1 tablet (800 mg total) by mouth 3 (three) times daily. 90 tablet 1    metroNIDAZOLE (METROGEL VAGINAL) 0.75 % vaginal gel Metrogel Vaginal 0.75 %  Insert 1 applicatorful twice a week by vaginal route at bedtime for 90 days.      Minoxidil (ROGAINE MENS EXTRA STRENGTH) 5 % FOAM Rogaine 5 %  topical foam  HALF A CUPFUL TO HAIR AS DIRECTED IN INSERT ONCE A DAY      omeprazole (PRILOSEC) 20 MG capsule Take 1 capsule (20 mg total) by mouth 2 (two) times daily before a meal. 14 capsule 0    oxyCODONE-acetaminophen (PERCOCET) 5-325 MG tablet Take 1 tablet by mouth every 6 (six) hours as needed for severe pain. 30 tablet 0    Secnidazole (SOLOSEC) 2 g PACK Solosec 2 gram oral DR granules in packet  TAKE 1 PACKET EVERY DAY BY ORAL ROUTE FOR 1 DAY.       Past Medical History:  Diagnosis Date   Depression    not on meds, doing good   Eczema    Infection    UTI   Urinary tract infection      No current facility-administered medications on file prior to encounter.   Current Outpatient Medications on File Prior to Encounter  Medication Sig Dispense Refill   Prenatal Vit-Fe Fumarate-FA (MULTIVITAMIN-PRENATAL) 27-0.8 MG TABS tablet Take 1 tablet by mouth daily at 12 noon.     EPINEPHrine 0.3 mg/0.3 mL IJ SOAJ injection INJECT 0.3 MLS (0.3 MG TOTAL) INTO THE MUSCLE ONCE FOR 1 DOSE.     fluconazole (DIFLUCAN) 150 MG tablet fluconazole 150 mg tablet  1 TABLET ORALLY ONCE THEN MAY REPEAT IN 3 DAYS IF NEEDED FOR VAGINAL YEAST SYMPTOMS.  ibuprofen (ADVIL) 800 MG tablet Take 1 tablet (800 mg total) by mouth 3 (three) times daily. 90 tablet 1   metroNIDAZOLE (METROGEL VAGINAL) 0.75 % vaginal gel Metrogel Vaginal 0.75 %  Insert 1 applicatorful twice a week by vaginal route at bedtime for 90 days.     Minoxidil (ROGAINE MENS EXTRA STRENGTH) 5 % FOAM Rogaine 5 % topical foam  HALF A CUPFUL TO HAIR AS DIRECTED IN INSERT ONCE A DAY     omeprazole (PRILOSEC) 20 MG capsule Take 1 capsule (20 mg total) by mouth 2 (two) times daily before a meal. 14 capsule 0   oxyCODONE-acetaminophen (PERCOCET) 5-325 MG tablet Take 1 tablet by mouth every 6 (six) hours as needed for severe pain. 30 tablet 0   Secnidazole (SOLOSEC) 2 g PACK Solosec 2 gram oral DR granules in packet  TAKE 1 PACKET EVERY DAY BY ORAL  ROUTE FOR 1 DAY.       No Known Allergies  History of present pregnancy: Pt Info/Preference:  Screening/Consents:  Labs:   EDD: Estimated Date of Delivery: 07/20/21  Establised: No LMP recorded. Patient is pregnant.  Anatomy Scan: Date: 04/01/2021 Placenta Location: posterior Genetic Screen: [place]:LR female AFP:  First Tri: Quad:  Office: [facility]            First PNV: 12.1 weeks Blood Type  AB+  Language: english Last PNV: 38.4 weeks Rhogam  N/A  Flu Vaccine:  utd   Antibody  Neg  TDaP vaccine utd   GTT: Early: 5.0 Third Trimester: 63  Feeding Plan: breast BTL: no Rubella: Immune   Contraception: ??? VBAC: no RPR:   NR  Circumcision: N/A   HBsAg:  Neg  Pediatrician:  ???   HIV:   Neg  Prenatal Classes: no Additional [place]: Growth 7/8 44%, 5.11lbs, vertex, posterior placenta.  GBS:  Positive (For PCN allergy, check sensitivities)       Chlamydia: neg    MFM Referral/Consult:  GC: neg  Support Person: No one   PAP: ???  Pain Management: natural Neonatologist Referral:  Hgb Electrophoresis:  AS  Birth Plan: All natural ok with augmention   Hgb NOB: 13.2    28W: 11.3   OB History     Gravida  5   Para  3   Term  3   Preterm      AB  1   Living  3      SAB  1   IAB      Ectopic      Multiple  0   Live Births  3          Past Medical History:  Diagnosis Date   Depression    not on meds, doing good   Eczema    Infection    UTI   Urinary tract infection    Past Surgical History:  Procedure Laterality Date   BIOPSY BREAST     WISDOM TOOTH EXTRACTION     Family History: family history includes Allergic rhinitis in her mother; Asthma in her mother; Eczema in her daughter; Hyperlipidemia in her father; Hypertension in her mother; Leukemia in her paternal grandmother; Sickle cell anemia in her paternal grandmother; Stroke in her maternal grandfather; Urticaria in her mother. Social History:  reports that she has never smoked. She has never used smokeless  tobacco. She reports current alcohol use. She reports that she does not use drugs.   Prenatal Transfer Tool  Maternal Diabetes: No Genetic Screening: Normal Maternal  Ultrasounds/Referrals: Normal Fetal Ultrasounds or other Referrals:  None Maternal Substance Abuse:  No Significant Maternal Medications:  None Significant Maternal Lab Results: Group B Strep negative  ROS:  Review of Systems  Constitutional: Negative.   HENT: Negative.    Eyes: Negative.   Respiratory: Negative.    Cardiovascular: Negative.   Gastrointestinal:  Positive for abdominal pain.  Genitourinary: Negative.   Musculoskeletal: Negative.   Skin: Negative.   Neurological: Negative.   Endo/Heme/Allergies: Negative.   Psychiatric/Behavioral: Negative.      Physical Exam: BP 115/77 (BP Location: Right Arm)   Pulse 89   Temp 98 F (36.7 C) (Oral)   Resp 18   Ht 5\' 5"  (1.651 m)   Wt 68.4 kg   SpO2 99%   BMI 25.08 kg/m   Physical Exam Vitals and nursing note reviewed.  HENT:     Head: Normocephalic and atraumatic.     Nose: Nose normal.     Mouth/Throat:     Mouth: Mucous membranes are moist.  Eyes:     Conjunctiva/sclera: Conjunctivae normal.  Cardiovascular:     Rate and Rhythm: Normal rate and regular rhythm.     Pulses: Normal pulses.     Heart sounds: Normal heart sounds.  Pulmonary:     Effort: Pulmonary effort is normal.     Breath sounds: Normal breath sounds.  Abdominal:     General: Bowel sounds are normal.  Genitourinary:    Comments: Uterus gravida equal to dates, pelvis adequate for vaginal delivery.  Musculoskeletal:        General: Normal range of motion.     Cervical back: Normal range of motion and neck supple.  Skin:    General: Skin is warm.     Capillary Refill: Capillary refill takes less than 2 seconds.  Neurological:     General: No focal deficit present.     Mental Status: She is alert.  Psychiatric:        Mood and Affect: Mood normal.     NST: FHR baseline  135 bpm, Variability: moderate, Accelerations:present, Decelerations:  Absent= Cat 1/Reactive UC:   irregular, every 4-7 minutes SVE:   Dilation: 4.5 Effacement (%): 70 Station: -2 Exam by:: F. Morris, RNC, vertex verified by fetal sutures.  Leopold's: Position vertex, EFW 7lbs via leopold's.  Pelvis proven to 7.7lbs  Labs: No results found for this or any previous visit (from the past 24 hour(s)).  Imaging:  No results found.  MAU Course: Orders Placed This Encounter  Procedures   Cervical Exam   No orders of the defined types were placed in this encounter.   Assessment/Plan: Linda Huber is a 30 y.o. female, G5P3013, IUP at 39.1 weeks, presenting for spontaneous latent labor at 4.5cm dilated, intact. H/O depression and PPD started on zoloft 50mg , mood stable. GBS+. Sickle cell trait. Pt endorse + Fm. Denies vaginal leakage. Denies vaginal bleeding.  FWB: Cat 1 Fetal Tracing.   Plan: Admit to Birthing Suite per consult with DR 26 Routine [facility] orders Pain med/epidural prn PCN G for GBS prophylaxis  Plan to augment with AROM once gbs adequately treated.  Anticipate labor progression   NP-C, CNM, MSN 07/18/2021, 3:53 PM

## 2021-07-18 NOTE — Progress Notes (Signed)
EFM tracing sketchy secondary pr standing @ bedside

## 2021-07-19 ENCOUNTER — Ambulatory Visit: Payer: Self-pay

## 2021-07-19 LAB — CBC
HCT: 30.2 % — ABNORMAL LOW (ref 36.0–46.0)
Hemoglobin: 10.5 g/dL — ABNORMAL LOW (ref 12.0–15.0)
MCH: 27.9 pg (ref 26.0–34.0)
MCHC: 34.8 g/dL (ref 30.0–36.0)
MCV: 80.1 fL (ref 80.0–100.0)
Platelets: 217 10*3/uL (ref 150–400)
RBC: 3.77 MIL/uL — ABNORMAL LOW (ref 3.87–5.11)
RDW: 13.2 % (ref 11.5–15.5)
WBC: 14.6 10*3/uL — ABNORMAL HIGH (ref 4.0–10.5)
nRBC: 0 % (ref 0.0–0.2)

## 2021-07-19 LAB — RPR: RPR Ser Ql: NONREACTIVE

## 2021-07-19 MED ORDER — IBUPROFEN 600 MG PO TABS
600.0000 mg | ORAL_TABLET | Freq: Four times a day (QID) | ORAL | 0 refills | Status: DC
Start: 2021-07-19 — End: 2022-01-07

## 2021-07-19 MED ORDER — ACETAMINOPHEN 325 MG PO TABS: 650.0000 mg | ORAL_TABLET | ORAL | 1 refills | Status: DC | PRN

## 2021-07-19 NOTE — Lactation Note (Addendum)
This note was copied from a baby's chart. Lactation Consultation Note  Patient Name: Linda Huber Date: 07/19/2021 Reason for consult: Initial assessment Age:30 hours Mom's feeding choice is breast and formula feeding. Per mom, infant latches well at the breast,  infant is currently being supplemented with 10 mls per feeding after breastfeeding this is  mom's choice. Infant breastfeeds for 10 minutes most feedings, LC encouraged mom to latch infant on both breast at a feeding  and try increase BF duration to 15 or 20 minutes to help establish mom's milk supply. LC did not observe latch at this time. Mom doesn't have any BF questions or concerns for LC. LC discussed infant's input and output with parents. Mom made aware of O/P services, breastfeeding support groups, community resources, and our phone # for post-discharge questions.   Mom's plan: 1- BF infant according to feeding cues, 8 to 12+ or more times within 24 hours, skin to skin.  2- Afterwards continue to supplement infant with her EBM/ and or formula if latched at breast 1st day 5- 10 mls per feeding, if formula only 5- 15 mls per feeding. Mom given BF supplemental guide sheet.  3- Mom will use hand pump when not latching infant at breast her choice this is to help stimulate breast establishing milk supply  and prevent engorgement, mom was fitted with 27 mm breast flange. 4- Mom will call RN or LC if she has any breastfeeding questions, concerns or needs assistance with latching infant at the breast.   Maternal Data Has patient been taught Hand Expression?: Yes Does the patient have breastfeeding experience prior to this delivery?: Yes How long did the patient breastfeed?: Per mom, she BF her other 3 children exclusively for one year, but chooses to breast and formula feed her  4 th child.  Feeding Mother's Current Feeding Choice: Breast Milk and Formula  LATCH Score                    Lactation Tools  Discussed/Used Tools: Pump Breast pump type: Manual Pump Education: Setup, frequency, and cleaning;Milk Storage Reason for Pumping: Mom will use hand pump when she choose not to latch infant at breast, to encourage breast stimulation and prevent engorgement. Pumping frequency: At [facility] discretion  Interventions    Discharge Pump: Personal;Manual WIC Program: No  Consult Status Consult Status: Follow-up Date: 07/20/21 Follow-up type: In-patient    [name] 07/19/2021, 4:34 PM

## 2021-07-19 NOTE — Discharge Summary (Signed)
Patient Name: Linda Huber DOB: 1991-07-24 MRN: [id number]  Date of admission: 07/18/2021 Delivery date:07/18/2021  Delivering provider: Noralyn Pick  Date of discharge: 07/19/2021  Admitting diagnosis: Normal labor and delivery [O80] Intrauterine pregnancy: [redacted]w[redacted]d    Secondary diagnosis:  Active Problems:   Normal labor and delivery  Additional problems: na    Discharge diagnosis: Term Pregnancy Delivered                                              Post partum procedures: na Augmentation: N/A Complications: None  Hospital course: Onset of Labor With Vaginal Delivery      30y.o. yo [id number] admitted in Latent Labor on 07/18/2021. Patient had an uncomplicated labor course as follows:  Membrane Rupture Time/Date: 9:31 PM ,07/18/2021   Delivery Method:Vaginal, Spontaneous  Episiotomy: None  Lacerations:    Patient had an uncomplicated postpartum course.  She is ambulating, tolerating a regular diet, passing flatus, and urinating well. Patient is discharged home in stable condition on 07/19/21.  Newborn Data: Birth date:07/18/2021  Birth time:9:36 PM  Gender:Female  Living status:Living  Apgars:9 ,9  Weight:3484 g   Magnesium Sulfate received: No BMZ received: No Rhophylac:N/A MMR:N/A T-DaP:Given prenatally Flu: N/A Transfusion:No  Physical exam  Vitals:   07/18/21 2316 07/18/21 2336 07/19/21 0036 07/19/21 0410  BP: 111/73 117/76 99/64 (!) 94/58  Pulse: 64 67 60 60  Resp: '15 18 18 18  ' Temp:  98 F (36.7 C) 98 F (36.7 C) 98 F (36.7 C)  TempSrc:  Oral Oral Oral  SpO2:  100% 100% 100%  Weight:      Height:       General: alert, cooperative, and no distress Lochia: appropriate Uterine Fundus: firm Incision: N/A DVT Evaluation: No evidence of DVT seen on physical exam. Negative Homan's sign. No cords or calf tenderness. No significant calf/ankle edema. Labs: Lab Results  Component Value Date   WBC 14.6 (H) 07/19/2021   HGB 10.5 (L)  07/19/2021   HCT 30.2 (L) 07/19/2021   MCV 80.1 07/19/2021   PLT 217 07/19/2021   CMP Latest Ref Rng & Units 12/19/2018  Glucose 70 - 99 mg/dL 71  BUN 6 - 23 mg/dL 13  Creatinine 0.40 - 1.20 mg/dL 0.67  Sodium 135 - 145 mEq/L 138  Potassium 3.5 - 5.1 mEq/L 4.1  Chloride 96 - 112 mEq/L 105  CO2 19 - 32 mEq/L 27  Calcium 8.4 - 10.5 mg/dL 9.3  Total Protein 6.0 - 8.3 g/dL -  Total Bilirubin 0.2 - 1.2 mg/dL -  Alkaline Phos 39 - 117 U/L -  AST 0 - 37 U/L -  ALT 0 - 35 U/L -   Edinburgh Score: No flowsheet data found.     Discharge home in stable condition Infant Feeding: Bottle and Breast Infant Disposition:home with mother Discharge instruction: per After Visit Summary and Postpartum booklet. Activity: Advance as tolerated. Pelvic rest for 6 weeks.  Diet: routine diet Anticipated Birth Control: Condoms Postpartum Appointment:6 weeks Additional Postpartum F/U:  na Future Appointments:No future appointments. Follow up Visit:  Follow-up Information     [facility]).   Specialty: Obstetrics and Gynecology Contact information: [id number] [phone]  07/19/2021 Ike Bene, CNM

## 2021-07-19 NOTE — Social Work (Signed)
CSW received consult for hx of Depression.  CSW met with MOB to offer support and complete assessment.     CSW introduced self and role. CSW observed MOB holding infant 'Bella' and support person also present. MOB provided CSW permission to complete assessment with support present. CSW informed MOB of reason for consult. MOB was understanding and reported she is currently doing well. MOB stated she had a great pregnancy. CSW inquired on MOB mental health history. 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 MOB shared it started in pregnancy and continued until infant was about 2 months old. MOB stated she started Zoloft for a couple of months and discontinued once she started feeling like herself again. MOB shared she also engaged in counseling. MOB reported she feels comfortable contacting a professional if mental health needs arise. MOB identified her mother as a primary support and denies any current SI or HI. MOB currently has an active prescription for Zoloft.  CSW reviewed education on the baby blues period, as well as postpartum depression. CSW provided MOB with resources. The New Mom Checklist was provided and MOB was encouraged to self evaluate postpartum.  CSW reviewed Sudden Infant Death Syndrome (SIDS) precautions. MOB was understanding and stated she has all infant essentials, including a bassinet and crib. MOB identified [facility] for follow-up care and denies any barriers. MOB denies any additional needs or questions at this time.   CSW identifies no further need for intervention and no barriers to discharge at this time.  Linda Huber, LCSWA Clinical Social Work [facility] [phone]  

## 2021-07-21 LAB — TYPE AND SCREEN
ABO/RH(D): AB POS
Antibody Screen: NEGATIVE

## 2021-08-04 DIAGNOSIS — L309 Dermatitis, unspecified: Secondary | ICD-10-CM | POA: Insufficient documentation

## 2022-01-02 ENCOUNTER — Telehealth: Payer: Self-pay | Admitting: Nurse Practitioner

## 2022-01-02 ENCOUNTER — Ambulatory Visit: Payer: Managed Care, Other (non HMO) | Admitting: Nurse Practitioner

## 2022-01-02 NOTE — Telephone Encounter (Signed)
Pt was a no show for her appointment.  

## 2022-01-07 ENCOUNTER — Encounter: Payer: Self-pay | Admitting: Nurse Practitioner

## 2022-01-07 ENCOUNTER — Other Ambulatory Visit: Payer: Self-pay

## 2022-01-07 ENCOUNTER — Ambulatory Visit (INDEPENDENT_AMBULATORY_CARE_PROVIDER_SITE_OTHER): Payer: Managed Care, Other (non HMO) | Admitting: Nurse Practitioner

## 2022-01-07 VITALS — BP 100/68 | HR 81 | Temp 97.4°F | Ht 65.0 in | Wt 130.0 lb

## 2022-01-07 DIAGNOSIS — R5383 Other fatigue: Secondary | ICD-10-CM | POA: Diagnosis not present

## 2022-01-07 DIAGNOSIS — O874 Varicose veins of lower extremity in the puerperium: Secondary | ICD-10-CM

## 2022-01-07 DIAGNOSIS — E559 Vitamin D deficiency, unspecified: Secondary | ICD-10-CM

## 2022-01-07 DIAGNOSIS — L659 Nonscarring hair loss, unspecified: Secondary | ICD-10-CM | POA: Diagnosis not present

## 2022-01-07 LAB — CBC WITH DIFFERENTIAL/PLATELET
Basophils Absolute: 0 10*3/uL (ref 0.0–0.1)
Basophils Relative: 0.7 % (ref 0.0–3.0)
Eosinophils Absolute: 0.2 10*3/uL (ref 0.0–0.7)
Eosinophils Relative: 3.1 % (ref 0.0–5.0)
HCT: 38.2 % (ref 36.0–46.0)
Hemoglobin: 12.9 g/dL (ref 12.0–15.0)
Lymphocytes Relative: 30.3 % (ref 12.0–46.0)
Lymphs Abs: 2 10*3/uL (ref 0.7–4.0)
MCHC: 33.9 g/dL (ref 30.0–36.0)
MCV: 87.1 fl (ref 78.0–100.0)
Monocytes Absolute: 0.4 10*3/uL (ref 0.1–1.0)
Monocytes Relative: 6 % (ref 3.0–12.0)
Neutro Abs: 3.9 10*3/uL (ref 1.4–7.7)
Neutrophils Relative %: 59.9 % (ref 43.0–77.0)
Platelets: 253 10*3/uL (ref 150.0–400.0)
RBC: 4.39 Mil/uL (ref 3.87–5.11)
RDW: 13 % (ref 11.5–15.5)
WBC: 6.6 10*3/uL (ref 4.0–10.5)

## 2022-01-07 LAB — COMPREHENSIVE METABOLIC PANEL
ALT: 14 U/L (ref 0–35)
AST: 19 U/L (ref 0–37)
Albumin: 4.3 g/dL (ref 3.5–5.2)
Alkaline Phosphatase: 57 U/L (ref 39–117)
BUN: 14 mg/dL (ref 6–23)
CO2: 28 mEq/L (ref 19–32)
Calcium: 9.4 mg/dL (ref 8.4–10.5)
Chloride: 104 mEq/L (ref 96–112)
Creatinine, Ser: 0.74 mg/dL (ref 0.40–1.20)
GFR: 108.36 mL/min (ref >60.00)
Glucose, Bld: 82 mg/dL (ref 70–99)
Potassium: 4 mEq/L (ref 3.5–5.1)
Sodium: 137 mEq/L (ref 135–145)
Total Bilirubin: 0.5 mg/dL (ref 0.2–1.2)
Total Protein: 7.7 g/dL (ref 6.0–8.3)

## 2022-01-07 LAB — TSH: TSH: 0.95 u[IU]/mL (ref 0.35–5.50)

## 2022-01-07 LAB — VITAMIN D 25 HYDROXY (VIT D DEFICIENCY, FRACTURES): VITD: 21.42 ng/mL — ABNORMAL LOW (ref 30.00–100.00)

## 2022-01-07 LAB — VITAMIN B12: Vitamin B-12: 434 pg/mL (ref 211–911)

## 2022-01-07 MED ORDER — VITAMIN D (ERGOCALCIFEROL) 1.25 MG (50000 UNIT) PO CAPS: 50000.0000 [IU] | ORAL_CAPSULE | ORAL | 0 refills | Status: DC

## 2022-01-07 NOTE — Patient Instructions (Addendum)
Use compression stocking daily and off at bedtime. Start prenatal multivitamin (nature made). Ok to use melatonin 3-5mg  at bedtime for sleep. You will be contacted to schedule appt with vein specialist. Sign medical release to get PAP report from GYN. Go to lab for blood draw.  Varicose Veins Varicose veins are veins that have become enlarged, bulged, and twisted. They most often appear in the legs. What are the causes? This condition is caused by damage to the valves in the vein. These valves help blood return to your heart. When they are damaged and they stop working properly, blood may flow backward and back up in the veins near the skin, causing the veins to get larger and appear twisted. The condition can result from any issue that causes blood to back up, like pregnancy, prolonged standing, or obesity. What increases the risk? The following factors may make you more likely to develop this condition: Being on your feet a lot. Being pregnant. Being overweight. Smoking. Having had a previous deep vein thrombosis or having a thrombotic disorder. Aging. The risk increases with age. Having a condition called Klippel-Trenaunay syndrome. What are the signs or symptoms? Symptoms of this condition include: Bulging, twisted, and bluish veins. A feeling of heaviness in your legs. This may be worse at the end of the day. Leg pain. This may be worse at the end of the day. Swelling in the leg. Changes in skin color over the veins. Swelling or pain in the legs can limit your activities. Your symptoms may get worse when you sit or stand for long periods of time. How is this diagnosed? This condition may be diagnosed based on: Your symptoms, family history, activity levels, and lifestyle. A physical exam. You may also have tests, including an ultrasound or X-ray. How is this treated? Treatment for this condition may involve: Avoiding sitting or standing in one position for long periods of  time. Wearing compression stockings. These stockings help to prevent blood clots and reduce swelling in the legs. Raising (elevating) the legs when resting. Losing weight. Exercising regularly. If you have persistent symptoms or want to improve the way your varicose veins look, you may choose to have a procedure to close the varicose veins off or to remove them. Nonsurgical treatments to close off the veins include: Sclerotherapy. In this treatment, a solution is injected into a vein to close it off. Laser treatment. The vein is heated with a laser to close it off. Radiofrequency vein ablation. An electrical current produced by radio waves is used to close off the vein. Surgical treatments to remove the veins include: Phlebectomy. In this procedure, the veins are removed through small incisions made over the veins. Vein ligation and stripping. In this procedure, incisions are made over the veins. The veins are then removed after being tied (ligated) with stitches (sutures). Follow these instructions at home: Medicines Take over-the-counter and prescription medicines only as told by your health care provider. If you were prescribed an antibiotic medicine, use it as told by your health care provider. Do not stop using the antibiotic even if you start to feel better. Activity Walk as much as possible. Walking increases blood flow. This helps blood return to the heart and takes pressure off your veins. Do not stand or sit in one position for a long period of time. Do not sit with your legs crossed. Avoid sitting for a long time without moving. Get up to take short walks every 1-2 hours. This is important to  improve blood flow and breathing. Ask for help if you feel weak or unsteady. Return to your normal activities as told by your health care provider. Ask your health care provider what activities are safe for you. Do exercises as told by your health care provider. General instructions  Follow  any diet instructions given to you by your health care provider. Elevate your legs at night to above the level of your heart. If you get a cut in the skin over the varicose vein and the vein bleeds: Lie down with your leg raised. Apply firm pressure to the cut with a clean cloth until the bleeding stops. Place a bandage (dressing) on the cut. Drink enough fluid to keep your urine pale yellow. Do not use any products that contain nicotine or tobacco. These products include cigarettes, chewing tobacco, and vaping devices, such as e-cigarettes. If you need help quitting, ask your health care provider. Wear compression stockings as told by your health care provider. Do not wear other kinds of tight clothing around your legs, pelvis, or waist. Keep all follow-up visits. This is important. Contact a health care provider if: The skin around your varicose veins starts to break down. You have more pain, redness, tenderness, or hard swelling over a vein. You are uncomfortable because of pain. You get a cut in the skin over a varicose vein and it will not stop bleeding. Get help right away if: You have chest pain. You have trouble breathing. You have severe leg pain. Summary Varicose veins are veins that have become enlarged, bulged, and twisted. They most often appear in the legs. This condition is caused by damage to the valves in the vein. These valves help blood return to your heart. Treatment for this condition includes frequent movements, wearing compression stockings, losing weight, and exercising regularly. In some cases, procedures are done to close off or remove the veins. Nonsurgical treatments to close off the veins include sclerotherapy, laser therapy, and radiofrequency vein ablation. This information is not intended to replace advice given to you by your health care provider. Make sure you discuss any questions you have with your health care provider. Document Revised: 05/14/2021  Document Reviewed: 05/14/2021 Elsevier Patient Education  2022 [employer].

## 2022-01-07 NOTE — Progress Notes (Signed)
Subjective:  Patient ID: Linda Huber, female    DOB: 08-Aug-1991  Age: 31 y.o. MRN: [id number]  CC: Acute Visit (Pt c/o hair loss and circulation issues and would like to discuss treatment options. /Pt states she has noticed swelling in her left leg, behind the knee up to her groin area x 6 months following the birth of her daughter./)  HPI Ms. Bartelson presents with multiple complaints: hair loss with bald spot, generalized fatigue, difficulty staying asleep, and left leg pain. Onset of symptoms since childbirth 23months ago. She had a vaginal delivery, denies any trauma, no breast feeding. She started taking ally multivitamin gummy 15month ago and hair/nail/skin gummy x 2weeks. No improvement. Left leg pain: Onset during pregnancy, some improvement after childbirth but has not resolved. Pain is intermittent, Associated with swelling and numbness Worse with prolonged standing or walking Improves with leg elevation.  Has mirena IUD in place for contraception Reviewed past Medical, Social and Family history today.  Outpatient Medications Prior to Visit  Medication Sig Dispense Refill   acetaminophen (TYLENOL) 325 MG tablet Take 2 tablets (650 mg total) by mouth every 4 (four) hours as needed (for pain scale < 4). (Patient not taking: Reported on 01/07/2022) 30 tablet 1   ibuprofen (ADVIL) 600 MG tablet Take 1 tablet (600 mg total) by mouth every 6 (six) hours. (Patient not taking: Reported on 01/07/2022) 30 tablet 0   No facility-administered medications prior to visit.    ROS See HPI  Objective:  BP 100/68 (BP Location: Left Arm, Patient Position: Sitting, Cuff Size: Normal)    Pulse 81    Temp (!) 97.4 F (36.3 C) (Temporal)    Ht 5\' 5"  (1.651 m)    Wt 130 lb (59 kg)    LMP  (LMP Unknown)    SpO2 98%    Breastfeeding No    BMI 21.63 kg/m   Physical Exam Vitals reviewed.  HENT:     Head:      Comments: Bald spot on occiptal region: about nickle size Neck:     Thyroid: No thyroid  mass, thyromegaly or thyroid tenderness.  Cardiovascular:     Rate and Rhythm: Normal rate.     Pulses: Normal pulses.  Pulmonary:     Effort: Pulmonary effort is normal.  Abdominal:     General: There is no distension.     Palpations: Abdomen is soft.     Tenderness: There is no abdominal tenderness. There is no guarding.     Hernia: There is no hernia in the left inguinal area or right inguinal area.  Musculoskeletal:        General: No tenderness or deformity. Normal range of motion.     Cervical back: Normal range of motion and neck supple.     Right hip: Normal.     Left hip: Normal.     Right upper leg: Normal.     Left upper leg: Normal.     Right knee: Normal.     Left knee: Normal.     Right lower leg: Normal. No edema.     Left lower leg: Normal. No edema.       Legs:     Comments: Varicose vein noted, no sign of thrombophlebitis  Lymphadenopathy:     Cervical: No cervical adenopathy.     Lower Body: No right inguinal adenopathy. No left inguinal adenopathy.  Skin:    Findings: No rash.  Neurological:     Mental Status:  She is alert and oriented to person, place, and time.   Assessment & Plan:  This visit occurred during the SARS-CoV-2 public health emergency.  Safety protocols were in place, including screening questions prior to the visit, additional usage of staff PPE, and extensive cleaning of exam room while observing appropriate contact time as indicated for disinfecting solutions.   Alleyna was seen today for acute visit.  Diagnoses and all orders for this visit:  Fatigue, unspecified type -     CBC with Differential/Platelet -     Comprehensive metabolic panel -     TSH -     Vitamin D (25 hydroxy) -     Iron, TIBC and Ferritin Panel -     B12  Hair loss -     CBC with Differential/Platelet -     Comprehensive metabolic panel -     TSH -     Vitamin D (25 hydroxy) -     Iron, TIBC and Ferritin Panel -     B12  Varicose vein of leg, postpartum -      Ambulatory referral to Vascular Surgery  Vitamin D deficiency -     Vitamin D (25 hydroxy) -     Vitamin D, Ergocalciferol, (DRISDOL) 1.25 MG (50000 UNIT) CAPS capsule; Take 1 capsule (50,000 Units total) by mouth every 7 (seven) days.  Use compression stocking daily and off at bedtime. Start prenatal multivitamin tablets (nature made). Ok to use melatonin 3-5mg  at bedtime prn for sleep. You will be contacted to schedule appt with vein specialist. Refer to dermatology if no identified deficiency. Sign medical release to get PAP report from GYN. Go to lab for blood draw.  Problem List Items Addressed This Visit   None Visit Diagnoses     Fatigue, unspecified type    -  Primary   Relevant Orders   CBC with Differential/Platelet (Completed)   Comprehensive metabolic panel (Completed)   TSH (Completed)   Vitamin D (25 hydroxy) (Completed)   Iron, TIBC and Ferritin Panel   B12 (Completed)   Hair loss       Relevant Orders   CBC with Differential/Platelet (Completed)   Comprehensive metabolic panel (Completed)   TSH (Completed)   Vitamin D (25 hydroxy) (Completed)   Iron, TIBC and Ferritin Panel   B12 (Completed)   Varicose vein of leg, postpartum       Relevant Orders   Ambulatory referral to Vascular Surgery   Vitamin D deficiency       Relevant Medications   Vitamin D, Ergocalciferol, (DRISDOL) 1.25 MG (50000 UNIT) CAPS capsule   Other Relevant Orders   Vitamin D (25 hydroxy) (Completed)       Follow-up: Return if symptoms worsen or fail to improve.  Wilfred Lacy, NP

## 2022-01-08 ENCOUNTER — Encounter: Payer: Self-pay | Admitting: Nurse Practitioner

## 2022-01-08 LAB — IRON,TIBC AND FERRITIN PANEL
%SAT: 30 % (calc) (ref 16–45)
Ferritin: 43 ng/mL (ref 16–154)
Iron: 100 ug/dL (ref 40–190)
TIBC: 334 mcg/dL (calc) (ref 250–450)

## 2022-01-08 NOTE — Telephone Encounter (Signed)
2nd no show, letter mailed, $50 fee generated KO 1/26

## 2022-01-25 ENCOUNTER — Other Ambulatory Visit: Payer: Self-pay

## 2022-01-25 DIAGNOSIS — I872 Venous insufficiency (chronic) (peripheral): Secondary | ICD-10-CM

## 2022-01-30 ENCOUNTER — Other Ambulatory Visit: Payer: Self-pay

## 2022-01-30 ENCOUNTER — Encounter: Payer: Managed Care, Other (non HMO) | Admitting: Vascular Surgery

## 2022-01-30 ENCOUNTER — Ambulatory Visit (HOSPITAL_COMMUNITY)
Admission: RE | Admit: 2022-01-30 | Discharge: 2022-01-30 | Disposition: A | Discharge status: Home / Self Care | Payer: BC Managed Care – PPO | Source: Ambulatory Visit | Attending: Vascular Surgery | Admitting: Vascular Surgery

## 2022-01-30 DIAGNOSIS — I872 Venous insufficiency (chronic) (peripheral): Secondary | ICD-10-CM | POA: Insufficient documentation

## 2022-02-04 ENCOUNTER — Encounter: Payer: Managed Care, Other (non HMO) | Admitting: Vascular Surgery

## 2022-02-25 ENCOUNTER — Encounter: Payer: Self-pay | Admitting: Vascular Surgery

## 2022-02-25 ENCOUNTER — Encounter: Payer: BC Managed Care – PPO | Admitting: Vascular Surgery

## 2022-02-25 ENCOUNTER — Other Ambulatory Visit: Payer: Self-pay

## 2022-02-25 ENCOUNTER — Ambulatory Visit (INDEPENDENT_AMBULATORY_CARE_PROVIDER_SITE_OTHER): Payer: BC Managed Care – PPO | Admitting: Vascular Surgery

## 2022-02-25 VITALS — BP 105/75 | HR 89 | Temp 97.9°F | Resp 20 | Ht 65.0 in | Wt 135.0 lb

## 2022-02-25 DIAGNOSIS — I83899 Varicose veins of unspecified lower extremities with other complications: Secondary | ICD-10-CM | POA: Diagnosis not present

## 2022-02-25 NOTE — Progress Notes (Signed)
? ?Patient ID: Linda Huber, female   DOB: [birth year], 31 y.o.   MRN: [id number] ? ?Reason for Consult: New Patient (Initial Visit) ?  ?Referred by Nche, Charlene Brooke, NP ? ?Subjective:  ?   ?HPI: ? ?Linda Huber is a 31 y.o. female without significant vascular disease.  She does have a brother with a DVT in her father also has varicose veins.  She has never had any venous intervention no DVTs herself.  She states that since her most recent pregnancy she has had prominent veins of her left inner thigh and left posterior knee.  These do cause her significant discomfort.  They are only prominent when she is standing otherwise they do not give her any problem.  She has not had any bleeding issues. ? ?Past Medical History:  ?Diagnosis Date  ? Depression   ? not on meds, doing good  ? Eczema   ? Infection   ? UTI  ? Urinary tract infection   ? ?Family History  ?Problem Relation Age of Onset  ? Leukemia Paternal Grandmother   ? Sickle cell anemia Paternal Grandmother   ? Stroke Maternal Grandfather   ? Hyperlipidemia Father   ? Asthma Mother   ? Hypertension Mother   ? Allergic rhinitis Mother   ? Urticaria Mother   ? Eczema Daughter   ? Cancer Neg Hx   ? Heart disease Neg Hx   ? ?Past Surgical History:  ?Procedure Laterality Date  ? BIOPSY BREAST    ? WISDOM TOOTH EXTRACTION    ? ? ?Short Social History:  ?Social History  ? ?Tobacco Use  ? Smoking status: Never  ? Smokeless tobacco: Never  ?Substance Use Topics  ? Alcohol use: Yes  ?  Comment: not with + preg  ? ? ?No Known Allergies ? ?Current Outpatient Medications  ?Medication Sig Dispense Refill  ? Vitamin D, Ergocalciferol, (DRISDOL) 1.25 MG (50000 UNIT) CAPS capsule Take 1 capsule (50,000 Units total) by mouth every 7 (seven) days. 12 capsule 0  ? ?No current facility-administered medications for this visit.  ? ? ?Review of Systems  ?Constitutional:  Constitutional negative. ?HENT: HENT negative.  ?Eyes: Eyes negative.  ?Respiratory: Respiratory negative.   ?Cardiovascular: Cardiovascular negative.  ?GI: Gastrointestinal negative.  ?Musculoskeletal: Positive for leg pain.  ?Skin:  ?     Painful veins left medial thigh and left posterior knee ?Neurological: Neurological negative. ?Hematologic: Hematologic/lymphatic negative.  ?Psychiatric: Psychiatric negative.   ? ?   ?Objective:  ?Objective  ? ?Vitals:  ? 02/25/22 1456  ?BP: 105/75  ?Pulse: 89  ?Resp: 20  ?Temp: 97.9 ?F (36.6 ?C)  ?SpO2: 98%  ?Weight: 135 lb (61.2 kg)  ?Height: 5\' 5"  (1.651 m)  ? ?Body mass index is 22.47 kg/m?. ? ?Physical Exam ?HENT:  ?   Head: Normocephalic.  ?   Nose:  ?   Comments: Wearing a mask ?Eyes:  ?   Pupils: Pupils are equal, round, and reactive to light.  ?Cardiovascular:  ?   Rate and Rhythm: Normal rate.  ?   Pulses: Normal pulses.  ?Pulmonary:  ?   Effort: Pulmonary effort is normal.  ?Abdominal:  ?   General: Abdomen is flat.  ?Musculoskeletal:  ?   Cervical back: Normal range of motion.  ?   Comments: Varicosities posterior left knee as pictured below  ?Skin: ?   General: Skin is warm and dry.  ?   Capillary Refill: Capillary refill takes less than 2 seconds.  ?  Neurological:  ?   General: No focal deficit present.  ?   Mental Status: She is alert.  ?Psychiatric:     ?   Mood and Affect: Mood normal.  ? ? ? ?Data: ?LEFT          Reflux NoRefluxReflux TimeDiameter cmsComments  ?                        Yes                                   ?+--------------+---------+------+-----------+------------+--------+  ?CFV                     yes   >1 second                       ?+--------------+---------+------+-----------+------------+--------+  ?FV mid        no                                              ?+--------------+---------+------+-----------+------------+--------+  ?Popliteal     no                                              ?+--------------+---------+------+-----------+------------+--------+  ?GSV at [facility]    no                             0.49              ?+--------------+---------+------+-----------+------------+--------+  ?GSV prox thigh          yes    >500 ms      0.38              ?+--------------+---------+------+-----------+------------+--------+  ?GSV mid thigh           yes    >500 ms      0.35              ?+--------------+---------+------+-----------+------------+--------+  ?GSV dist thighno                            0.37              ?+--------------+---------+------+-----------+------------+--------+  ?GSV at knee   no                            0.57              ?+--------------+---------+------+-----------+------------+--------+  ?GSV prox calf           yes    >500 ms      0.34              ?+--------------+---------+------+-----------+------------+--------+  ?SSV Pop Fossa no                            0.16              ?+--------------+---------+------+-----------+------------+--------+  ?SSV prox calf no  0.26              ?+--------------+---------+------+-----------+------------+--------+  ?SSV mid calf  no                            0.18              ?+--------------+---------+------+-----------+------------+--------+  ? ?   ? ?Summary:  ?Left:  ?- No evidence of deep vein thrombosis seen in the left lower extremity,  ?from the common femoral through the popliteal veins.  ?- No evidence of superficial venous thrombosis in the left lower  ?extremity.  ?   ?- Venous reflux is noted in the left common femoral vein.  ?- Venous reflux is noted in the left greater saphenous vein in the  ?proximal and mid thigh. Varicosity of concern in the groin area appears to  ?join great saphenous vein in the mid thigh.  ?- Venous reflux is noted in the left greater saphenous vein in the calf.  ?   ?    ?Assessment/Plan:  ?  ?31 year old female with C1 venous disease with no reflux superficial reticular veins of her left medial thigh and left posterior knee as  pictured above.  I have discussed with her possible sclerotherapy and I will have Izora Gala reach out to her in the near future. ? ?  ? ?Waynetta Sandy MD ?[facility] ? ? ?

## 2022-04-28 ENCOUNTER — Ambulatory Visit (INDEPENDENT_AMBULATORY_CARE_PROVIDER_SITE_OTHER): Payer: BC Managed Care – PPO | Admitting: Nurse Practitioner

## 2022-04-28 ENCOUNTER — Encounter: Payer: Self-pay | Admitting: Nurse Practitioner

## 2022-04-28 ENCOUNTER — Ambulatory Visit (INDEPENDENT_AMBULATORY_CARE_PROVIDER_SITE_OTHER): Payer: BC Managed Care – PPO

## 2022-04-28 VITALS — BP 118/74 | HR 84 | Temp 97.3°F | Wt 134.4 lb

## 2022-04-28 DIAGNOSIS — J208 Acute bronchitis due to other specified organisms: Secondary | ICD-10-CM | POA: Diagnosis not present

## 2022-04-28 DIAGNOSIS — N906 Unspecified hypertrophy of vulva: Secondary | ICD-10-CM | POA: Diagnosis not present

## 2022-04-28 DIAGNOSIS — R0981 Nasal congestion: Secondary | ICD-10-CM | POA: Diagnosis not present

## 2022-04-28 DIAGNOSIS — E559 Vitamin D deficiency, unspecified: Secondary | ICD-10-CM

## 2022-04-28 DIAGNOSIS — R059 Cough, unspecified: Secondary | ICD-10-CM | POA: Diagnosis not present

## 2022-04-28 DIAGNOSIS — R61 Generalized hyperhidrosis: Secondary | ICD-10-CM | POA: Diagnosis not present

## 2022-04-28 LAB — VITAMIN D 25 HYDROXY (VIT D DEFICIENCY, FRACTURES): VITD: 34.89 ng/mL (ref 30.00–100.00)

## 2022-04-28 MED ORDER — AZELASTINE-FLUTICASONE 137-50 MCG/ACT NA SUSP: 1.0000 | Freq: Two times a day (BID) | NASAL | 0 refills | Status: DC

## 2022-04-28 MED ORDER — MONTELUKAST SODIUM 10 MG PO TABS: 10.0000 mg | ORAL_TABLET | Freq: Every day | ORAL | 3 refills | Status: DC

## 2022-04-28 NOTE — Patient Instructions (Signed)
Go to lab ?Start dymista nasal spray and montelukast. ?You will be contacted to schedule appt for pelvic US. ?

## 2022-04-28 NOTE — Assessment & Plan Note (Signed)
First noticed during pregnancy. Child is now 1months old. ?Swelling and pain in left labia with prolonged sitting and standing. ?Associated with intermittent left leg pain ?No inguinal lymphadenopathy. ? ?Bartholin cyst vs venous congestion? ?Referred to vein and vascular clinic, but no recommendation given. ?Will obtain pelvic US ?May need to GYN appt? ?

## 2022-04-28 NOTE — Progress Notes (Signed)
? ?Acute Office Visit ? ?Subjective:  ? ? Patient ID: Linda Huber, female    DOB: Sep 09, 1991, 31 y.o.   MRN: [id number] ? ?Chief Complaint  ?Patient presents with  ? cough  ?  Patient states that she has had a cough for past month and a half. She states she has tried OTC nyquil with no relief. She denies fevers and body aches.   ? ?Cough ?This is a new problem. The current episode started more than 1 month ago. The problem has been waxing and waning. The problem occurs constantly. The cough is Productive of sputum. Associated symptoms include nasal congestion, postnasal drip, rhinorrhea, shortness of breath and wheezing. Pertinent negatives include no chest pain, chills, ear congestion, ear pain, fever, headaches, heartburn, hemoptysis, myalgias, rash or sore throat. Nothing aggravates the symptoms. She has tried OTC cough suppressant and cool air for the symptoms. The treatment provided mild relief. There is no history of environmental allergies.  ?No improvement with zyrtec and claritin. ? ?Labia enlarged ?First noticed during pregnancy. Child is now 54months old. ?Swelling and pain in left labia with prolonged sitting and standing. ?Associated with intermittent left leg pain ?No inguinal lymphadenopathy. ? ?Bartholin cyst vs venous congestion? ?Referred to vein and vascular clinic, but no recommendation given. ?Will obtain pelvic US ?May need to GYN appt?  ? ?Outpatient Medications Prior to Visit  ?Medication Sig  ? Vitamin D, Ergocalciferol, (DRISDOL) 1.25 MG (50000 UNIT) CAPS capsule Take 1 capsule (50,000 Units total) by mouth every 7 (seven) days. (Patient not taking: Reported on 04/28/2022)  ? ?No facility-administered medications prior to visit.  ? ?Reviewed past medical and social history.  ?Review of Systems  ?Constitutional:  Negative for chills and fever.  ?HENT:  Positive for postnasal drip and rhinorrhea. Negative for ear pain and sore throat.   ?Respiratory:  Positive for cough, shortness of breath  and wheezing. Negative for hemoptysis.   ?Cardiovascular:  Negative for chest pain.  ?Gastrointestinal:  Negative for heartburn.  ?Musculoskeletal:  Negative for myalgias.  ?Skin:  Negative for rash.  ?Allergic/Immunologic: Negative for environmental allergies.  ?Neurological:  Negative for headaches.  Per HPI ? ?   ?Objective:  ?  ?Physical Exam ?Vitals reviewed. Exam conducted with a chaperone present.  ?Cardiovascular:  ?   Rate and Rhythm: Normal rate.  ?   Pulses: Normal pulses.  ?Pulmonary:  ?   Effort: Pulmonary effort is normal.  ?Abdominal:  ?   Hernia: There is no hernia in the left inguinal area or right inguinal area.  ?Genitourinary: ?   Exam position: Lithotomy position.  ?   Labia:     ?   Right: No rash, tenderness, lesion or injury.     ?   Left: Lesion present. No rash or tenderness.   ?Lymphadenopathy:  ?   Lower Body: No right inguinal adenopathy. No left inguinal adenopathy.  ?Neurological:  ?   Mental Status: She is alert and oriented to person, place, and time.  ? ?BP 118/74 (BP Location: Left Arm, Patient Position: Sitting, Cuff Size: Large)   Pulse 84   Temp (!) 97.3 ?F (36.3 ?C) (Temporal)   Wt 134 lb 6.4 oz (61 kg)   SpO2 99%   BMI 22.37 kg/m?  ? ? ?Results for orders placed or performed in visit on 04/28/22  ?Vitamin D (25 hydroxy)  ?Result Value Ref Range  ? VITD 34.89 30.00 - 100.00 ng/mL  ? ? ?   ?Assessment & Plan:  ? ?  Problem List Items Addressed This Visit   ? ?  ? Genitourinary  ? Labia enlarged  ?  First noticed during pregnancy. Child is now 30months old. ?Swelling and pain in left labia with prolonged sitting and standing. ?Associated with intermittent left leg pain ?No inguinal lymphadenopathy. ? ?Bartholin cyst vs venous congestion? ?Referred to vein and vascular clinic, but no recommendation given. ?Will obtain pelvic US ?May need to GYN appt? ? ?  ?  ? Relevant Orders  ? US Pelvis Limited  ?  ? Other  ? Vitamin D deficiency  ? Relevant Orders  ? Vitamin D (25 hydroxy)  (Completed)  ? ?Other Visit Diagnoses   ? ? Acute bronchitis due to other specified organisms    -  Primary  ? Relevant Medications  ? montelukast (SINGULAIR) 10 MG tablet  ? methylPREDNISolone (MEDROL DOSEPAK) 4 MG TBPK tablet  ? Other Relevant Orders  ? DG Chest 2 View (Completed)  ? Sinus congestion      ? Relevant Medications  ? montelukast (SINGULAIR) 10 MG tablet  ? Azelastine-Fluticasone 137-50 MCG/ACT SUSP  ? methylPREDNISolone (MEDROL DOSEPAK) 4 MG TBPK tablet  ? ?  ? ? ? ?Meds ordered this encounter  ?Medications  ? montelukast (SINGULAIR) 10 MG tablet  ?  Sig: Take 1 tablet (10 mg total) by mouth at bedtime.  ?  Dispense:  30 tablet  ?  Refill:  3  ?  Order Specific Question:   Supervising Provider  ?  Answer:   Abelino Derrick ALFRED [[id number]]  ? Azelastine-Fluticasone 137-50 MCG/ACT SUSP  ?  Sig: Place 1 spray into the nose every 12 (twelve) hours.  ?  Dispense:  23 g  ?  Refill:  0  ?  Order Specific Question:   Supervising Provider  ?  Answer:   Abelino Derrick ALFRED [[id number]]  ? methylPREDNISolone (MEDROL DOSEPAK) 4 MG TBPK tablet  ?  Sig: Take as directed on package  ?  Dispense:  21 tablet  ?  Refill:  0  ?  Order Specific Question:   Supervising Provider  ?  Answer:   Abelino Derrick ALFRED [[id number]]  ? ?Return if symptoms worsen or fail to improve. ? ?Wilfred Lacy, NP ? ?

## 2022-04-29 ENCOUNTER — Encounter: Payer: Self-pay | Admitting: Nurse Practitioner

## 2022-04-29 MED ORDER — METHYLPREDNISOLONE 4 MG PO TBPK: ORAL_TABLET | ORAL | 0 refills | Status: DC

## 2022-05-06 ENCOUNTER — Telehealth: Payer: Self-pay | Admitting: Nurse Practitioner

## 2022-05-06 NOTE — Telephone Encounter (Signed)
Called & verified w/ Joni Reining.

## 2022-05-06 NOTE — Telephone Encounter (Signed)
Linda Huber from [facility] [phone] ex [phone] called and needs clarification on her [place]. She needs to know if [place] of her pelvic is trans vag only or with? Please advise

## 2022-05-12 ENCOUNTER — Other Ambulatory Visit: Payer: BC Managed Care – PPO

## 2022-05-12 ENCOUNTER — Ambulatory Visit
Admission: RE | Admit: 2022-05-12 | Discharge: 2022-05-12 | Disposition: A | Discharge status: Home / Self Care | Payer: BC Managed Care – PPO | Source: Ambulatory Visit | Attending: Nurse Practitioner | Admitting: Nurse Practitioner

## 2022-05-12 DIAGNOSIS — N9089 Other specified noninflammatory disorders of vulva and perineum: Secondary | ICD-10-CM | POA: Diagnosis not present

## 2022-05-12 DIAGNOSIS — N906 Unspecified hypertrophy of vulva: Secondary | ICD-10-CM

## 2022-06-24 DIAGNOSIS — I8312 Varicose veins of left lower extremity with inflammation: Secondary | ICD-10-CM | POA: Diagnosis not present

## 2022-06-24 DIAGNOSIS — I8311 Varicose veins of right lower extremity with inflammation: Secondary | ICD-10-CM | POA: Diagnosis not present

## 2022-07-29 DIAGNOSIS — I8312 Varicose veins of left lower extremity with inflammation: Secondary | ICD-10-CM | POA: Diagnosis not present

## 2022-08-12 DIAGNOSIS — G9389 Other specified disorders of brain: Secondary | ICD-10-CM | POA: Diagnosis not present

## 2022-08-12 DIAGNOSIS — Z3202 Encounter for pregnancy test, result negative: Secondary | ICD-10-CM | POA: Diagnosis not present

## 2022-08-12 DIAGNOSIS — R937 Abnormal findings on diagnostic imaging of other parts of musculoskeletal system: Secondary | ICD-10-CM | POA: Diagnosis not present

## 2022-08-12 DIAGNOSIS — R200 Anesthesia of skin: Secondary | ICD-10-CM | POA: Diagnosis not present

## 2022-08-12 DIAGNOSIS — M47812 Spondylosis without myelopathy or radiculopathy, cervical region: Secondary | ICD-10-CM | POA: Diagnosis not present

## 2022-08-12 DIAGNOSIS — R519 Headache, unspecified: Secondary | ICD-10-CM | POA: Diagnosis not present

## 2022-08-12 DIAGNOSIS — R202 Paresthesia of skin: Secondary | ICD-10-CM | POA: Diagnosis not present

## 2022-08-17 ENCOUNTER — Telehealth: Payer: BC Managed Care – PPO | Admitting: Physician Assistant

## 2022-08-17 DIAGNOSIS — R3989 Other symptoms and signs involving the genitourinary system: Secondary | ICD-10-CM

## 2022-08-17 MED ORDER — SULFAMETHOXAZOLE-TRIMETHOPRIM 800-160 MG PO TABS: 1.0000 | ORAL_TABLET | Freq: Two times a day (BID) | ORAL | 0 refills | Status: DC

## 2022-08-17 NOTE — Progress Notes (Signed)
Virtual Visit Consent   [name], you are scheduled for a virtual visit with a [facility] provider today. Just as with appointments in the office, your consent must be obtained to participate. Your consent will be active for this visit and any virtual visit you may have with one of our providers in the next 365 days. If you have a MyChart account, a copy of this consent can be sent to you electronically.  As this is a virtual visit, video technology does not allow for your provider to perform a traditional examination. This may limit your provider's ability to fully assess your condition. If your provider identifies any concerns that need to be evaluated in person or the need to arrange testing (such as labs, EKG, etc.), we will make arrangements to do so. Although advances in technology are sophisticated, we cannot ensure that it will always work on either your end or our end. If the connection with a video visit is poor, the visit may have to be switched to a telephone visit. With either a video or telephone visit, we are not always able to ensure that we have a secure connection.  By engaging in this virtual visit, you consent to the provision of healthcare and authorize for your insurance to be billed (if applicable) for the services provided during this visit. Depending on your insurance coverage, you may receive a charge related to this service.  I need to obtain your verbal consent now. Are you willing to proceed with your visit today? [name] has provided verbal consent on 08/17/2022 for a virtual visit (video or telephone). Margaretann Loveless, PA-C  Date: 08/17/2022 7:50 AM  Virtual Visit via Video Note   I, Margaretann Loveless, connected with  Linda Huber  ([id number], September 06, 1991) on 08/17/22 at  7:45 AM EDT by a video-enabled telemedicine application and verified that I am speaking with the correct person using two identifiers.  Location: Patient: Virtual Visit Location  Patient: Home Provider: Virtual Visit Location Provider: Home Office   I discussed the limitations of evaluation and management by telemedicine and the availability of in person appointments. The patient expressed understanding and agreed to proceed.    History of Present Illness: Linda Huber is a 31 y.o. who identifies as a female who was assigned female at birth, and is being seen today for possible UTI.  HPI: Urinary Tract Infection  This is a new problem. The current episode started in the past 7 days (3 days). The problem occurs every urination. The problem has been gradually worsening. The quality of the pain is described as aching and burning. The pain is mild. There has been no fever. Associated symptoms include chills, frequency, hesitancy, nausea (mild) and urgency. Pertinent negatives include no discharge, flank pain, hematuria or vomiting. Associated symptoms comments: Foul odor. She has tried acetaminophen (AZO) for the symptoms. The treatment provided mild relief. There is no history of recurrent UTIs.      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   Intrauterine contraceptive device 11/22/2019   Ganglion of right wrist 01/12/2019   Seasonal allergies 04/13/2018   Anemia 02/19/2017   Chronic constipation 02/19/2017   Sickle cell trait (HCC) 11/09/2014   Depressive disorder 11/09/2014   Abnormal cervical Papanicolaou smear 07/29/2009    Allergies: No Known Allergies  Medications:  Current Outpatient Medications:    sulfamethoxazole-trimethoprim (BACTRIM DS) 800-160 MG tablet, Take 1 tablet by mouth 2 (two) times daily., Disp: 10 tablet, Rfl: 0   Azelastine-Fluticasone 137-50 MCG/ACT SUSP, Place 1 spray into the nose every 12 (twelve) hours., Disp: 23 g, Rfl: 0   methylPREDNISolone (MEDROL DOSEPAK) 4 MG TBPK tablet, Take as  directed on package, Disp: 21 tablet, Rfl: 0   montelukast (SINGULAIR) 10 MG tablet, Take 1 tablet (10 mg total) by mouth at bedtime., Disp: 30 tablet, Rfl: 3   Vitamin D, Ergocalciferol, (DRISDOL) 1.25 MG (50000 UNIT) CAPS capsule, Take 1 capsule (50,000 Units total) by mouth every 7 (seven) days. (Patient not taking: Reported on 04/28/2022), Disp: 12 capsule, Rfl: 0  Observations/Objective: Patient is well-developed, well-nourished in no acute distress.  Resting comfortably  at home.  Head is normocephalic, atraumatic.  No labored breathing.  Speech is clear and coherent with logical content.  Patient is alert and oriented at baseline.    Assessment and Plan: 1. Suspected UTI - sulfamethoxazole-trimethoprim (BACTRIM DS) 800-160 MG tablet; Take 1 tablet by mouth 2 (two) times daily.  Dispense: 10 tablet; Refill: 0  - Worsening symptoms.  - Will treat empirically with Bactrim - May use AZO for bladder spasms - Continue to push fluids.  - Seek in person evaluation for urine culture if symptoms do not improve or if they worsen.    Follow Up Instructions: I discussed the assessment and treatment plan with the patient. The patient was provided an opportunity to ask questions and all were answered. The patient agreed with the plan and demonstrated an understanding of the instructions.  A copy of instructions were sent to the patient via MyChart unless otherwise noted below.    The patient was advised to call back or seek an in-person evaluation if the symptoms worsen or if the condition fails to improve as anticipated.  Time:  I spent 8 minutes with the patient via telehealth technology discussing the above problems/concerns.    Margaretann Loveless, PA-C

## 2022-08-17 NOTE — Patient Instructions (Signed)
Linda Huber, thank you for joining Margaretann Loveless, PA-C for today's virtual visit.  While this provider is not your primary care provider (PCP), if your PCP is located in our provider database this encounter information will be shared with them immediately following your visit.  Consent: (Patient) Linda Huber provided verbal consent for this virtual visit at the beginning of the encounter.  Current Medications:  Current Outpatient Medications:    sulfamethoxazole-trimethoprim (BACTRIM DS) 800-160 MG tablet, Take 1 tablet by mouth 2 (two) times daily., Disp: 10 tablet, Rfl: 0   Azelastine-Fluticasone 137-50 MCG/ACT SUSP, Place 1 spray into the nose every 12 (twelve) hours., Disp: 23 g, Rfl: 0   methylPREDNISolone (MEDROL DOSEPAK) 4 MG TBPK tablet, Take as directed on package, Disp: 21 tablet, Rfl: 0   montelukast (SINGULAIR) 10 MG tablet, Take 1 tablet (10 mg total) by mouth at bedtime., Disp: 30 tablet, Rfl: 3   Vitamin D, Ergocalciferol, (DRISDOL) 1.25 MG (50000 UNIT) CAPS capsule, Take 1 capsule (50,000 Units total) by mouth every 7 (seven) days. (Patient not taking: Reported on 04/28/2022), Disp: 12 capsule, Rfl: 0   Medications ordered in this encounter:  Meds ordered this encounter  Medications   sulfamethoxazole-trimethoprim (BACTRIM DS) 800-160 MG tablet    Sig: Take 1 tablet by mouth 2 (two) times daily.    Dispense:  10 tablet    Refill:  0    Order Specific Question:   Supervising Provider    Answer:   Hyacinth Meeker, BRIAN [[id number]]     *If you need refills on other medications prior to your next appointment, please contact your pharmacy*  Follow-Up: Call back or seek an in-person evaluation if the symptoms worsen or if the condition fails to improve as anticipated.  Other Instructions Urinary Tract Infection, Adult  A urinary tract infection (UTI) is an infection of any part of the urinary tract. The urinary tract includes the kidneys, ureters, bladder, and urethra. These  organs make, store, and get rid of urine in the body. An upper UTI affects the ureters and kidneys. A lower UTI affects the bladder and urethra. What are the causes? Most urinary tract infections are caused by bacteria in your genital area around your urethra, where urine leaves your body. These bacteria grow and cause inflammation of your urinary tract. What increases the risk? You are more likely to develop this condition if: You have a urinary catheter that stays in place. You are not able to control when you urinate or have a bowel movement (incontinence). You are female and you: Use a spermicide or diaphragm for birth control. Have low estrogen levels. Are pregnant. You have certain genes that increase your risk. You are sexually active. You take antibiotic medicines. You have a condition that causes your flow of urine to slow down, such as: An enlarged prostate, if you are female. Blockage in your urethra. A kidney stone. A nerve condition that affects your bladder control (neurogenic bladder). Not getting enough to drink, or not urinating often. You have certain medical conditions, such as: Diabetes. A weak disease-fighting system (immunesystem). Sickle cell disease. Gout. Spinal cord injury. What are the signs or symptoms? Symptoms of this condition include: Needing to urinate right away (urgency). Frequent urination. This may include small amounts of urine each time you urinate. Pain or burning with urination. Blood in the urine. Urine that smells bad or unusual. Trouble urinating. Cloudy urine. Vaginal discharge, if you are female. Pain in the abdomen or the lower  back. You may also have: Vomiting or a decreased appetite. Confusion. Irritability or tiredness. A fever or chills. Diarrhea. The first symptom in older adults may be confusion. In some cases, they may not have any symptoms until the infection has worsened. How is this diagnosed? This condition is  diagnosed based on your medical history and a physical exam. You may also have other tests, including: Urine tests. Blood tests. Tests for STIs (sexually transmitted infections). If you have had more than one UTI, a cystoscopy or imaging studies may be done to determine the cause of the infections. How is this treated? Treatment for this condition includes: Antibiotic medicine. Over-the-counter medicines to treat discomfort. Drinking enough water to stay hydrated. If you have frequent infections or have other conditions such as a kidney stone, you may need to see a health care provider who specializes in the urinary tract (urologist). In rare cases, urinary tract infections can cause sepsis. Sepsis is a life-threatening condition that occurs when the body responds to an infection. Sepsis is treated in the hospital with IV antibiotics, fluids, and other medicines. Follow these instructions at home:  Medicines Take over-the-counter and prescription medicines only as told by your health care provider. If you were prescribed an antibiotic medicine, take it as told by your health care provider. Do not stop using the antibiotic even if you start to feel better. General instructions Make sure you: Empty your bladder often and completely. Do not hold urine for long periods of time. Empty your bladder after sex. Wipe from front to back after urinating or having a bowel movement if you are female. Use each tissue only one time when you wipe. Drink enough fluid to keep your urine pale yellow. Keep all follow-up visits. This is important. Contact a health care provider if: Your symptoms do not get better after 1-2 days. Your symptoms go away and then return. Get help right away if: You have severe pain in your back or your lower abdomen. You have a fever or chills. You have nausea or vomiting. Summary A urinary tract infection (UTI) is an infection of any part of the urinary tract, which includes  the kidneys, ureters, bladder, and urethra. Most urinary tract infections are caused by bacteria in your genital area. Treatment for this condition often includes antibiotic medicines. If you were prescribed an antibiotic medicine, take it as told by your health care provider. Do not stop using the antibiotic even if you start to feel better. Keep all follow-up visits. This is important. This information is not intended to replace advice given to you by your health care provider. Make sure you discuss any questions you have with your health care provider. Document Revised: 07/12/2020 Document Reviewed: 07/12/2020 Elsevier Patient Education  2023 Elsevier Inc.    If you have been instructed to have an in-person evaluation today at a local Urgent Care facility, please use the link below. It will take you to a list of all of our available [facility], including address, phone number and hours of operation. Please do not delay care.  [facility]  If you or a family member do not have a primary care provider, use the link below to schedule a visit and establish care. When you choose a [facility] primary care physician or advanced practice provider, you gain a long-term partner in health. Find a Primary Care Provider  Learn more about [facility]'s in-office and virtual care options: [facility] - Get Care  Now

## 2022-10-31 ENCOUNTER — Telehealth: Payer: BC Managed Care – PPO | Admitting: Nurse Practitioner

## 2022-10-31 DIAGNOSIS — J029 Acute pharyngitis, unspecified: Secondary | ICD-10-CM

## 2022-10-31 NOTE — Progress Notes (Signed)
  E-Visit for Sore Throat  We are sorry that you are not feeling well.  Here is how we plan to help!  Your symptoms indicate a likely viral infection (Pharyngitis).   Pharyngitis is inflammation in the back of the throat which can cause a sore throat, scratchiness and sometimes difficulty swallowing.   Pharyngitis is typically caused by a respiratory virus and will just run its course.  Please keep in mind that your symptoms could last up to 10 days.  For throat pain, we recommend over the counter oral pain relief medications such as acetaminophen or aspirin, or anti-inflammatory medications such as ibuprofen or naproxen sodium.  Topical treatments such as oral throat lozenges or sprays may be used as needed.  Avoid close contact with loved ones, especially the very young and elderly.  Remember to wash your hands thoroughly throughout the day as this is the number one way to prevent the spread of infection and wipe down door knobs and counters with disinfectant.  After careful review of your answers, I would not recommend an antibiotic for your condition.  Antibiotics should not be used to treat conditions that we suspect are caused by viruses like the virus that causes the common cold or flu. However, some people can have Strep with atypical symptoms. You may need formal testing in clinic or office to confirm if your symptoms continue or worsen.  Providers prescribe antibiotics to treat infections caused by bacteria. Antibiotics are very powerful in treating bacterial infections when they are used properly.  To maintain their effectiveness, they should be used only when necessary.  Overuse of antibiotics has resulted in the development of super bugs that are resistant to treatment!    Home Care: Only take medications as instructed by your medical team. Do not drink alcohol while taking these medications. A steam or ultrasonic humidifier can help congestion.  You can place a towel over your head and  breathe in the steam from hot water coming from a faucet. Avoid close contacts especially the very young and the elderly. Cover your mouth when you cough or sneeze. Always remember to wash your hands.  Get Help Right Away If: You develop worsening fever or throat pain. You develop a severe head ache or visual changes. Your symptoms persist after you have completed your treatment plan.  Make sure you Understand these instructions. Will watch your condition. Will get help right away if you are not doing well or get worse.   Thank you for choosing an e-visit.  Your e-visit answers were reviewed by a board certified advanced clinical practitioner to complete your personal care plan. Depending upon the condition, your plan could have included both over the counter or prescription medications.  Please review your pharmacy choice. Make sure the pharmacy is open so you can pick up prescription now. If there is a problem, you may contact your provider through MyChart messaging and have the prescription routed to another pharmacy.  Your safety is important to us. If you have drug allergies check your prescription carefully.   For the next 24 hours you can use MyChart to ask questions about today's visit, request a non-urgent call back, or ask for a work or school excuse. You will get an email in the next two days asking about your experience. I hope that your e-visit has been valuable and will speed your recovery.  Mary-Margaret Clyda Smyth, FNP   5-10 minutes spent reviewing and documenting in chart.  

## 2023-04-16 ENCOUNTER — Telehealth (INDEPENDENT_AMBULATORY_CARE_PROVIDER_SITE_OTHER): Payer: Medicaid Other | Admitting: Nurse Practitioner

## 2023-04-16 ENCOUNTER — Encounter: Payer: Self-pay | Admitting: Nurse Practitioner

## 2023-04-16 VITALS — Ht 65.0 in | Wt 140.0 lb

## 2023-04-16 DIAGNOSIS — J301 Allergic rhinitis due to pollen: Secondary | ICD-10-CM | POA: Diagnosis not present

## 2023-04-16 MED ORDER — METHYLPREDNISOLONE 4 MG PO TBPK: ORAL_TABLET | ORAL | 0 refills | Status: DC

## 2023-04-16 MED ORDER — MONTELUKAST SODIUM 10 MG PO TABS: 10.0000 mg | ORAL_TABLET | Freq: Every day | ORAL | 1 refills | Status: DC

## 2023-04-16 MED ORDER — FLUTICASONE PROPIONATE 50 MCG/ACT NA SUSP
1.0000 | Freq: Two times a day (BID) | NASAL | 5 refills | Status: AC
Start: 2023-04-16 — End: ?

## 2023-04-16 MED ORDER — FEXOFENADINE HCL 180 MG PO TABS
180.0000 mg | ORAL_TABLET | Freq: Every day | ORAL | Status: AC
Start: 2023-04-16 — End: ?

## 2023-04-16 MED ORDER — AZELASTINE HCL 0.1 % NA SOLN: 1.0000 | Freq: Two times a day (BID) | NASAL | 12 refills | Status: DC

## 2023-04-16 NOTE — Progress Notes (Signed)
Virtual Visit via Video Note  I connected withNAME@ on 04/16/23 at  1:00 PM EDT by a video enabled telemedicine application and verified that I am speaking with the correct person using two identifiers.  Location: Patient:Home Provider: Office Participants: patient and provider  I discussed the limitations of evaluation and management by telemedicine and the availability of in person appointments. I also discussed with the patient that there may be a patient responsible charge related to this service. The patient expressed understanding and agreed to proceed.  CC: nasal and sinus congestion x 11month  History of Present Illness: Sinus Problem This is a recurrent problem. The current episode started more than 1 month ago. The problem is unchanged. There has been no fever. Associated symptoms include congestion, headaches, a hoarse voice, sinus pressure, sneezing and a sore throat. Pertinent negatives include no chills, coughing, diaphoresis, ear pain, neck pain, shortness of breath or swollen glands. Treatments tried: zyrtec, benadryl and clairtin. The treatment provided no relief.   Previous use of flonase, montelukast and medrol dose pack with significant relief.  Observations/Objective: Physical Exam Vitals reviewed.  Constitutional:      General: She is not in acute distress.    Appearance: She is not ill-appearing.  HENT:     Head: Normocephalic.     Jaw: There is normal jaw occlusion.  Eyes:     General: Lids are normal.     Extraocular Movements: Extraocular movements intact.     Conjunctiva/sclera: Conjunctivae normal.  Pulmonary:     Effort: Pulmonary effort is normal.  Neurological:     Mental Status: She is oriented to person, place, and time.     Assessment and Plan: Generose was seen today for allergies.  Diagnoses and all orders for this visit:  Seasonal allergic rhinitis due to pollen -     methylPREDNISolone (MEDROL DOSEPAK) 4 MG TBPK tablet; Take as directed on  package -     montelukast (SINGULAIR) 10 MG tablet; Take 1 tablet (10 mg total) by mouth at bedtime. -     azelastine (ASTELIN) 0.1 % nasal spray; Place 1 spray into both nostrils 2 (two) times daily. Use in each nostril as directed -     fluticasone (FLONASE) 50 MCG/ACT nasal spray; Place 1 spray into both nostrils 2 (two) times daily. -     fexofenadine (ALLEGRA ALLERGY) 180 MG tablet; Take 1 tablet (180 mg total) by mouth at bedtime.   Follow Up Instructions: Start montelukast, medrol dose pack and allegra Once you complete medrol dose pack in 5days, start flonase and azelastine nasal sprays. Use montelukast, allegra and nasal sprays daily x 3months. Use saline sinus rinse once a day.   I discussed the assessment and treatment plan with the patient. The patient was provided an opportunity to ask questions and all were answered. The patient agreed with the plan and demonstrated an understanding of the instructions.   The patient was advised to call back or seek an in-person evaluation if the symptoms worsen or if the condition fails to improve as anticipated.  Alysia Penna, NP

## 2023-04-16 NOTE — Patient Instructions (Addendum)
Start montelukast, medrol dose pack and allegra Once you complete medrol dose pack in 5days, start flonase and azelastine nasal sprays. Use montelukast, allegra and nasal sprays daily x 3months. Use saline sinus rinse once a day.  Allergic Rhinitis, Adult  Allergic rhinitis is an allergic reaction that affects the mucous membrane inside the nose. The mucous membrane is the tissue that produces mucus. There are two types of allergic rhinitis: Seasonal. This type is also called hay fever and happens only during certain seasons. Perennial. This type can happen at any time of the year. Allergic rhinitis cannot be spread from person to person. This condition can be mild, bad, or very bad. It can develop at any age and may be outgrown. What are the causes? This condition is caused by allergens. These are things that can cause an allergic reaction. Allergens may differ for seasonal allergic rhinitis and perennial allergic rhinitis. Seasonal allergic rhinitis is caused by pollen. Pollen can come from grasses, trees, and weeds. Perennial allergic rhinitis may be caused by: Dust mites. Proteins in a pet's pee (urine), saliva, or dander. Dander is dead skin cells from a pet. Smoke, mold, or car fumes. Remains of or waste from insects such as cockroaches. What increases the risk? You are more likely to develop this condition if you have a family history of allergies or other conditions related to allergies, including: Allergic conjunctivitis. This is irritation and swelling of parts of the eyes and eyelids. Asthma. This condition affects the lungs and makes it hard to breathe. Atopic dermatitis or eczema. This is long term (chronic) irritation and swelling of the skin. Food allergies. What are the signs or symptoms? Symptoms of this condition include: Sneezing or coughing. A stuffy nose (nasal congestion), itchy nose, or nasal discharge. Itchy eyes and tearing of the eyes. A feeling of mucus  dripping down the back of your throat (postnasal drip). This may cause a sore throat. Trouble sleeping. Tiredness. Headache. How is this diagnosed? This condition may be diagnosed with your symptoms, your medical history, and a physical exam. Your health care provider may check for related conditions, such as: Asthma. Pink eye. This is eye swelling and irritation caused by infection (conjunctivitis). Ear infection. Upper respiratory infection. This is an infection in the nose, throat, or upper airways. You may also have tests to find out which allergens cause your symptoms. These may include skin tests or blood tests. How is this treated? There is no cure for this condition, but treatment can help control symptoms. Treatment may include: Taking medicines that block allergy symptoms, such as corticosteroids (anti-inflammatories) and antihistamines. Medicine may be given as a shot, nasal spray, or pill. Avoiding any allergens. Being exposed again and again to tiny amounts of allergens to help you build a defense against allergens (allergenimmunotherapy). This is done if other treatments have not helped. It may include: Allergy shots. These are injected medicines that have small amounts of an allergen in them. Sublingual immunotherapy. This involves taking small doses of a medicine with an allergen in it under your tongue. If these treatments do not work, your provider may prescribe newer, stronger medicines. Follow these instructions at home: Avoiding allergens Find out what you are allergic to and avoid those allergens. These are some things you can do to help avoid allergens: If you have perennial allergies: Replace carpet with wood, tile, or vinyl flooring. Carpet can trap dander and dust. Do not smoke. Do not allow smoking in your home Change your heating and  air conditioning filters at least once a month. If you have seasonal allergies, take these steps during allergy season: Keep  windows closed as much as possible. Plan outdoor activities when pollen counts are lowest. Check pollen counts before you plan outdoor activities When coming indoors, change clothing and shower before sitting on furniture or bedding. If you have a pet in the house that produces allergens: Keep the pet out of the bedroom. Vacuum, sweep, and dust regularly. General instructions Take over-the-counter and prescription medicines only as told by your provider. Drink enough fluid to keep your pee pale yellow. Where to find more information American Academy of Allergy, Asthma & Immunology: aaaai.org Contact a health care provider if: You have a fever. You develop a cough that does not go away. You make high-pitched whistling sounds when you breathe, most often when you breathe out (wheeze). Your symptoms slow you down or stop you from doing your normal activities each day. Get help right away if: You have shortness of breath. This symptom may be an emergency. Get help right away. Call 911. Do not wait to see if the symptoms will go away. Do not drive yourself to the hospital. This information is not intended to replace advice given to you by your health care provider. Make sure you discuss any questions you have with your health care provider. Document Revised: 08/10/2022 Document Reviewed: 08/10/2022 Elsevier Patient Education  2023 [employer].

## 2023-08-10 ENCOUNTER — Encounter: Payer: Self-pay | Admitting: Nurse Practitioner

## 2023-08-11 ENCOUNTER — Telehealth: Payer: Self-pay | Admitting: Nurse Practitioner

## 2023-08-11 ENCOUNTER — Ambulatory Visit: Payer: Medicaid Other | Admitting: Nurse Practitioner

## 2023-08-11 NOTE — Telephone Encounter (Signed)
NS no reason letter printed

## 2023-08-12 NOTE — Telephone Encounter (Signed)
1st no show since 12/2021 , letter sent via mail

## 2023-08-27 ENCOUNTER — Encounter: Payer: Self-pay | Admitting: Nurse Practitioner

## 2023-08-27 ENCOUNTER — Telehealth: Payer: Self-pay | Admitting: Nurse Practitioner

## 2023-08-27 ENCOUNTER — Ambulatory Visit: Payer: Medicaid Other | Admitting: Nurse Practitioner

## 2023-08-27 NOTE — Telephone Encounter (Signed)
2nd no show, final warning letter sent via mail and mychart to reschedule/notify of policy

## 2023-08-27 NOTE — Telephone Encounter (Signed)
Pt was a no show for an OV with [facility] on 08/27/23, I did not send a letter.

## 2023-09-15 ENCOUNTER — Telehealth: Payer: Self-pay | Admitting: Nurse Practitioner

## 2023-09-15 ENCOUNTER — Ambulatory Visit: Payer: Medicaid Other | Admitting: Nurse Practitioner

## 2023-09-15 NOTE — Telephone Encounter (Signed)
Pt showed up late to her OV with [facility] on 09/15/23, she was rescheduled. I did not send a letter.

## 2023-09-15 NOTE — Telephone Encounter (Signed)
Pt was late today. She has rescheduled for 10/3.   No show 08/11/2023 and 08/27/2023. Please advise how to proceed.

## 2023-09-15 NOTE — Telephone Encounter (Signed)
FYI: This call has been transferred to triage nurse: the Triage Nurse. Once the result note has been entered staff can address the message at that time.  Patient called in with the following symptoms:  Red Word: stabbing pain when she breathes in deep and she is unable to walk  at that time for the past two weeks +   Please advise at [facility] [phone]  Message is routed to Provider Pool.

## 2023-09-16 ENCOUNTER — Encounter: Payer: Self-pay | Admitting: Nurse Practitioner

## 2023-09-16 ENCOUNTER — Ambulatory Visit: Payer: Medicaid Other | Admitting: Nurse Practitioner

## 2023-09-16 VITALS — BP 110/70 | HR 66 | Temp 97.9°F | Ht 65.0 in | Wt 146.8 lb

## 2023-09-16 DIAGNOSIS — M545 Low back pain, unspecified: Secondary | ICD-10-CM

## 2023-09-16 MED ORDER — IBUPROFEN 600 MG PO TABS
600.0000 mg | ORAL_TABLET | Freq: Three times a day (TID) | ORAL | 0 refills | Status: AC | PRN
Start: 2023-09-16 — End: ?

## 2023-09-16 MED ORDER — CYCLOBENZAPRINE HCL 5 MG PO TABS
5.0000 mg | ORAL_TABLET | Freq: Every day | ORAL | 0 refills | Status: DC
Start: 2023-09-16 — End: 2024-03-01

## 2023-09-16 NOTE — Progress Notes (Signed)
Established Patient Visit  Patient: Linda Huber   DOB: [birth year]   32 y.o. Female  MRN: [id number] Visit Date: 09/16/2023  Subjective:    Chief Complaint  Patient presents with   Acute Visit    She states she has been having pain in her back for a little over 2 weeks that happens when she takes a deep breath.   Back Pain This is a new problem. The current episode started 1 to 4 weeks ago (2weeks). The problem has been waxing and waning since onset. The pain is present in the lumbar spine. The quality of the pain is described as cramping. The pain does not radiate. Pertinent negatives include no abdominal pain, bladder incontinence, bowel incontinence, chest pain, dysuria, fever, headaches, leg pain, numbness, paresis, paresthesias, pelvic pain, perianal numbness, tingling, weakness or weight loss. Risk factors include poor posture, lack of exercise and sedentary lifestyle. She has tried NSAIDs for the symptoms. The treatment provided significant relief.   Wt Readings from Last 3 Encounters:  09/16/23 146 lb 12.8 oz (66.6 kg)  04/16/23 140 lb (63.5 kg)  04/28/22 134 lb 6.4 oz (61 kg)    Reviewed medical, surgical, and social history today  Medications: Outpatient Medications Prior to Visit  Medication Sig   azelastine (ASTELIN) 0.1 % nasal spray Place 1 spray into both nostrils 2 (two) times daily. Use in each nostril as directed   [DISCONTINUED] fexofenadine (ALLEGRA ALLERGY) 180 MG tablet Take 1 tablet (180 mg total) by mouth at bedtime. (Patient not taking: Reported on 09/16/2023)   [DISCONTINUED] fluticasone (FLONASE) 50 MCG/ACT nasal spray Place 1 spray into both nostrils 2 (two) times daily. (Patient not taking: Reported on 09/16/2023)   [DISCONTINUED] methylPREDNISolone (MEDROL DOSEPAK) 4 MG TBPK tablet Take as directed on package (Patient not taking: Reported on 09/16/2023)   [DISCONTINUED] montelukast (SINGULAIR) 10 MG tablet Take 1 tablet (10 mg total) by mouth at  bedtime. (Patient not taking: Reported on 09/16/2023)   No facility-administered medications prior to visit.   Reviewed past medical and social history.   ROS per HPI above      Objective:  BP 110/70   Pulse 66   Temp 97.9 F (36.6 C) (Temporal)   Ht 5\' 5"  (1.651 m)   Wt 146 lb 12.8 oz (66.6 kg)   SpO2 98%   BMI 24.43 kg/m      Physical Exam Vitals reviewed.  Cardiovascular:     Rate and Rhythm: Normal rate.     Pulses: Normal pulses.  Pulmonary:     Effort: Pulmonary effort is normal.  Musculoskeletal:     Thoracic back: Normal.     Lumbar back: Normal.     Right lower leg: No edema.     Left lower leg: No edema.  Neurological:     Mental Status: She is alert and oriented to person, place, and time.     No results found for any visits on 09/16/23.    Assessment & Plan:    Problem List Items Addressed This Visit   None Visit Diagnoses     Acute bilateral low back pain without sciatica    -  Primary   Relevant Medications   cyclobenzaprine (FLEXERIL) 5 MG tablet   ibuprofen (ADVIL) 600 MG tablet   Other Relevant Orders   Urinalysis w microscopic + reflex cultur      Return in about 2 weeks (around  09/30/2023) for CPE (fasting) and back pain /fup.     Alysia Penna, NP

## 2023-09-16 NOTE — Patient Instructions (Signed)
Go to lab  Back Exercises These exercises help to make your trunk and back strong. They also help to keep the lower back flexible. Doing these exercises can help to prevent or lessen pain in your lower back. If you have back pain, try to do these exercises 2-3 times each day or as told by your doctor. As you get better, do the exercises once each day. Repeat the exercises more often as told by your doctor. To stop back pain from coming back, do the exercises once each day, or as told by your doctor. Do exercises exactly as told by your doctor. Stop right away if you feel sudden pain or your pain gets worse. Exercises Single knee to chest Do these steps 3-5 times in a row for each leg: Lie on your back on a firm bed or the floor with your legs stretched out. Bring one knee to your chest. Grab your knee or thigh with both hands and hold it in place. Pull on your knee until you feel a gentle stretch in your lower back or butt. Keep doing the stretch for 10-30 seconds. Slowly let go of your leg and straighten it. Pelvic tilt Do these steps 5-10 times in a row: Lie on your back on a firm bed or the floor with your legs stretched out. Bend your knees so they point up to the ceiling. Your feet should be flat on the floor. Tighten your lower belly (abdomen) muscles to press your lower back against the floor. This will make your tailbone point up to the ceiling instead of pointing down to your feet or the floor. Stay in this position for 5-10 seconds while you gently tighten your muscles and breathe evenly. Cat-cow Do these steps until your lower back bends more easily: Get on your hands and knees on a firm bed or the floor. Keep your hands under your shoulders, and keep your knees under your hips. You may put padding under your knees. Let your head hang down toward your chest. Tighten (contract) the muscles in your belly. Point your tailbone toward the floor so your lower back becomes rounded like  the back of a cat. Stay in this position for 5 seconds. Slowly lift your head. Let the muscles of your belly relax. Point your tailbone up toward the ceiling so your back forms a sagging arch like the back of a cow. Stay in this position for 5 seconds.  Press-ups Do these steps 5-10 times in a row: Lie on your belly (face-down) on a firm bed or the floor. Place your hands near your head, about shoulder-width apart. While you keep your back relaxed and keep your hips on the floor, slowly straighten your arms to raise the top half of your body and lift your shoulders. Do not use your back muscles. You may change where you place your hands to make yourself more comfortable. Stay in this position for 5 seconds. Keep your back relaxed. Slowly return to lying flat on the floor.  Bridges Do these steps 10 times in a row: Lie on your back on a firm bed or the floor. Bend your knees so they point up to the ceiling. Your feet should be flat on the floor. Your arms should be flat at your sides, next to your body. Tighten your butt muscles and lift your butt off the floor until your waist is almost as high as your knees. If you do not feel the muscles working in your butt  and the back of your thighs, slide your feet 1-2 inches (2.5-5 cm) farther away from your butt. Stay in this position for 3-5 seconds. Slowly lower your butt to the floor, and let your butt muscles relax. If this exercise is too easy, try doing it with your arms crossed over your chest. Belly crunches Do these steps 5-10 times in a row: Lie on your back on a firm bed or the floor with your legs stretched out. Bend your knees so they point up to the ceiling. Your feet should be flat on the floor. Cross your arms over your chest. Tip your chin a little bit toward your chest, but do not bend your neck. Tighten your belly muscles and slowly raise your chest just enough to lift your shoulder blades a tiny bit off the floor. Avoid raising  your body higher than that because it can put too much stress on your lower back. Slowly lower your chest and your head to the floor. Back lifts Do these steps 5-10 times in a row: Lie on your belly (face-down) with your arms at your sides, and rest your forehead on the floor. Tighten the muscles in your legs and your butt. Slowly lift your chest off the floor while you keep your hips on the floor. Keep the back of your head in line with the curve in your back. Look at the floor while you do this. Stay in this position for 3-5 seconds. Slowly lower your chest and your face to the floor. Contact a doctor if: Your back pain gets a lot worse when you do an exercise. Your back pain does not get better within 2 hours after you exercise. If you have any of these problems, stop doing the exercises. Do not do them again unless your doctor says it is okay. Get help right away if: You have sudden, very bad back pain. If this happens, stop doing the exercises. Do not do them again unless your doctor says it is okay. This information is not intended to replace advice given to you by your health care provider. Make sure you discuss any questions you have with your health care provider. Document Revised: 02/12/2021 Document Reviewed: 02/12/2021 Elsevier Patient Education  2024 [employer].

## 2023-09-17 LAB — URINALYSIS W MICROSCOPIC + REFLEX CULTURE
Bilirubin Urine: NEGATIVE
Glucose, UA: NEGATIVE
Hgb urine dipstick: NEGATIVE
Hyaline Cast: NONE SEEN /[LPF]
Ketones, ur: NEGATIVE
Leukocyte Esterase: NEGATIVE
Nitrites, Initial: NEGATIVE
Protein, ur: NEGATIVE
RBC / HPF: NONE SEEN /[HPF] (ref 0–2)
Specific Gravity, Urine: 1.009 (ref 1.001–1.035)
WBC, UA: NONE SEEN /[HPF] (ref 0–5)
pH: 7 (ref 5.0–8.0)

## 2023-09-17 LAB — NO CULTURE INDICATED

## 2024-03-01 ENCOUNTER — Encounter: Payer: Self-pay | Admitting: Nurse Practitioner

## 2024-03-01 ENCOUNTER — Telehealth: Payer: Self-pay

## 2024-03-01 ENCOUNTER — Ambulatory Visit: Admitting: Nurse Practitioner

## 2024-03-01 VITALS — BP 110/70 | HR 71 | Temp 98.7°F | Ht 65.0 in | Wt 143.6 lb

## 2024-03-01 DIAGNOSIS — E559 Vitamin D deficiency, unspecified: Secondary | ICD-10-CM

## 2024-03-01 DIAGNOSIS — Z01818 Encounter for other preprocedural examination: Secondary | ICD-10-CM | POA: Diagnosis not present

## 2024-03-01 DIAGNOSIS — J301 Allergic rhinitis due to pollen: Secondary | ICD-10-CM

## 2024-03-01 LAB — COMPREHENSIVE METABOLIC PANEL
ALT: 12 U/L (ref 0–35)
AST: 16 U/L (ref 0–37)
Albumin: 4.9 g/dL (ref 3.5–5.2)
Alkaline Phosphatase: 56 U/L (ref 39–117)
BUN: 12 mg/dL (ref 6–23)
CO2: 28 meq/L (ref 19–32)
Calcium: 10.1 mg/dL (ref 8.4–10.5)
Chloride: 102 meq/L (ref 96–112)
Creatinine, Ser: 0.73 mg/dL (ref 0.40–1.20)
GFR: 108.49 mL/min (ref >60.00)
Glucose, Bld: 89 mg/dL (ref 70–99)
Potassium: 4.3 meq/L (ref 3.5–5.1)
Sodium: 138 meq/L (ref 135–145)
Total Bilirubin: 0.6 mg/dL (ref 0.2–1.2)
Total Protein: 8.1 g/dL (ref 6.0–8.3)

## 2024-03-01 LAB — CBC
HCT: 40.9 % (ref 36.0–46.0)
Hemoglobin: 13.8 g/dL (ref 12.0–15.0)
MCHC: 33.7 g/dL (ref 30.0–36.0)
MCV: 90 fl (ref 78.0–100.0)
Platelets: 252 10*3/uL (ref 150.0–400.0)
RBC: 4.54 Mil/uL (ref 3.87–5.11)
RDW: 12.6 % (ref 11.5–15.5)
WBC: 8.8 10*3/uL (ref 4.0–10.5)

## 2024-03-01 LAB — PROTIME-INR
INR: 1.1 ratio — ABNORMAL HIGH (ref 0.8–1.0)
Prothrombin Time: 11.5 s (ref 9.6–13.1)

## 2024-03-01 LAB — VITAMIN D 25 HYDROXY (VIT D DEFICIENCY, FRACTURES): VITD: 20.95 ng/mL — ABNORMAL LOW (ref 30.00–100.00)

## 2024-03-01 LAB — APTT: aPTT: 26.2 s (ref 25.4–36.8)

## 2024-03-01 MED ORDER — FEXOFENADINE HCL 180 MG PO TABS: 180.0000 mg | ORAL_TABLET | Freq: Every day | ORAL | Status: AC

## 2024-03-01 MED ORDER — AZELASTINE HCL 0.1 % NA SOLN: 1.0000 | Freq: Two times a day (BID) | NASAL | 12 refills | Status: AC

## 2024-03-01 MED ORDER — FLUTICASONE PROPIONATE 50 MCG/ACT NA SUSP: 2.0000 | Freq: Every day | NASAL | 0 refills | Status: AC

## 2024-03-01 NOTE — Assessment & Plan Note (Signed)
 Reports nasal congestion and post nasal drainage. No fever, no swollen lymph nodes.  Advised to start allegra. Main also use Flonase and azelastine nasal spray if needed

## 2024-03-01 NOTE — Telephone Encounter (Addendum)
 Lab Results need to be faxed to Dr. Chesley Noon at [phone] once received for surgical clearance. CBC, PT/PTT, CMP

## 2024-03-01 NOTE — Telephone Encounter (Addendum)
 PAPERWORK/FORMS received  Dropped off by: patient during 03/01/24 OV Call back #: [phone] Individual made aware of 3-5 business day turn around (YES/NO): Y GREEN charge sheet completed and patient made aware of possible charge (YES/NO): N Placed in provider folder at front desk. ~~~ route to CMA/provider Team  CLINICAL USE BELOW THIS LINE (use X to signify action taken)  _X__ Form received and placed in providers office for signature. - provider completed at time of appt ___ Form completed and faxed to LOA Dept.  ___ Form completed & LVM to notify patient ready for pick up.  ___ Charge sheet and copy of form in front office folder for office supervisor.   Completed paperwork given to patient at conclusion of visit. Copy sent to HIM to be scanned into Media.

## 2024-03-01 NOTE — Progress Notes (Signed)
 Complete physical exam  Patient: Linda Huber   DOB: 1991/03/16   32 y.o. Female  MRN: [id number] Visit Date: 03/01/2024  Subjective:    Chief Complaint  Patient presents with   Pre-op Exam    Pt schedule dfor 03/16/24. Lbs needed. Pt is leaving form that needs to be completed. Requesting records for PAP from [facility]   Linda Huber is a 33 y.o. female who presents today for a pre op clearance for bilateral breast augmentation on 03/16/2024. She denies any personal or family history of anesthesia complication or bleeding disorder or clotting disorder.  BP Readings from Last 3 Encounters:  03/01/24 110/70  09/16/23 110/70  04/28/22 118/74   Wt Readings from Last 3 Encounters:  03/01/24 143 lb 9.6 oz (65.1 kg)  09/16/23 146 lb 12.8 oz (66.6 kg)  04/16/23 140 lb (63.5 kg)   Most recent fall risk assessment:    03/01/2024    1:47 PM  Fall Risk   Falls in the past year? 0  Injury with Fall? 0  Risk for fall due to : No Fall Risks   Depression screen:Yes - No Depression Most recent depression screenings:    03/01/2024    1:47 PM 04/16/2023   11:44 AM  PHQ 2/9 Scores  PHQ - 2 Score 0 0  PHQ- 9 Score  0   HPI  Seasonal allergic rhinitis due to pollen Reports nasal congestion and post nasal drainage. No fever, no swollen lymph nodes.  Advised to start allegra. Main also use Flonase and azelastine nasal spray if needed  Past Medical History:  Diagnosis Date   Depression    not on meds, doing good   Diabetes mellitus without complication (HCC)    Eczema    Infection    UTI   Urinary tract infection    Past Surgical History:  Procedure Laterality Date   BIOPSY BREAST     WISDOM TOOTH EXTRACTION     Social History   Socioeconomic History   Marital status: Single    Spouse name: Not on file   Number of children: 3   Years of education: Not on file   Highest education level: Not on file  Occupational History   Not on file  Tobacco Use    Smoking status: Never   Smokeless tobacco: Never  Vaping Use   Vaping status: Never Used  Substance and Sexual Activity   Alcohol use: Yes    Comment: not with + preg   Drug use: No   Sexual activity: Yes    Birth control/protection: I.U.D.  Other Topics Concern   Not on file  Social History Narrative   Not on file   Social Drivers of Health   Financial Resource Strain: Not on file  Food Insecurity: Not on file  Transportation Needs: Not on file  Physical Activity: Not on file  Stress: Not on file  Social Connections: Unknown (08/12/2022)   Received from [facility], [facility]   Social Network    Social Network: Not on file  Intimate Partner Violence: Unknown (08/12/2022)   Received from [employer], [facility]   HITS    Physically Hurt: Not on file    Insult or Talk Down To: Not on file    Threaten Physical Harm: Not on file    Scream or Curse: Not on file   Family Status  Relation Name Status   PGM  Deceased   MGF  Deceased  PGF  Deceased   MGM  Alive   Father  Alive   Mother  Alive   Daughter  (Not Specified)   Neg Hx  (Not Specified)  No partnership data on file   Family History  Problem Relation Age of Onset   Leukemia Paternal Grandmother    Sickle cell anemia Paternal Grandmother    Stroke Maternal Grandfather    Hyperlipidemia Father    Asthma Mother    Hypertension Mother    Allergic rhinitis Mother    Urticaria Mother    Eczema Daughter    Cancer Neg Hx    Heart disease Neg Hx    No Known Allergies  Patient Care Team: Leda Bellefeuille, Bonna Gains, NP as PCP - General (Internal Medicine) Hoover Browns, MD as Consulting Physician (Obstetrics and Gynecology)   Medications: Outpatient Medications Prior to Visit  Medication Sig   ibuprofen (ADVIL) 600 MG tablet Take 1 tablet (600 mg total) by mouth every 8 (eight) hours as needed. With food   [DISCONTINUED] azelastine (ASTELIN) 0.1 % nasal spray Place 1 spray into both nostrils 2 (two) times  daily. Use in each nostril as directed   [DISCONTINUED] cyclobenzaprine (FLEXERIL) 5 MG tablet Take 1 tablet (5 mg total) by mouth at bedtime. (Patient not taking: Reported on 03/01/2024)   No facility-administered medications prior to visit.    Review of Systems  Constitutional:  Negative for activity change, appetite change and unexpected weight change.  Respiratory: Negative.    Cardiovascular: Negative.   Gastrointestinal: Negative.   Endocrine: Negative for cold intolerance and heat intolerance.  Genitourinary: Negative.   Musculoskeletal: Negative.   Skin: Negative.   Neurological: Negative.   Hematological: Negative.   Psychiatric/Behavioral:  Negative for behavioral problems, decreased concentration, dysphoric mood, hallucinations, self-injury, sleep disturbance and suicidal ideas. The patient is not nervous/anxious.        Objective:  BP 110/70   Pulse 71   Temp 98.7 F (37.1 C) (Temporal)   Ht 5\' 5"  (1.651 m)   Wt 143 lb 9.6 oz (65.1 kg)   SpO2 97%   BMI 23.90 kg/m     Physical Exam Vitals and nursing note reviewed.  Constitutional:      General: She is not in acute distress. HENT:     Head: Normocephalic.     Nose: Nose normal.     Mouth/Throat:     Mouth: Mucous membranes are moist.     Dentition: Normal dentition. No dental tenderness or dental caries.     Palate: No mass and lesions.     Pharynx: Oropharynx is clear. Uvula midline.     Tonsils: No tonsillar exudate. 0 on the right. 0 on the left.  Eyes:     Extraocular Movements: Extraocular movements intact.     Conjunctiva/sclera: Conjunctivae normal.     Pupils: Pupils are equal, round, and reactive to light.  Neck:     Thyroid: No thyroid mass, thyromegaly or thyroid tenderness.  Cardiovascular:     Rate and Rhythm: Normal rate and regular rhythm.     Pulses: Normal pulses.     Heart sounds: Normal heart sounds.  Pulmonary:     Effort: Pulmonary effort is normal.     Breath sounds: Normal  breath sounds.  Musculoskeletal:        General: Normal range of motion.     Cervical back: Normal range of motion and neck supple.     Right lower leg: No edema.  Left lower leg: No edema.  Lymphadenopathy:     Cervical: No cervical adenopathy.  Skin:    General: Skin is warm and dry.  Neurological:     Mental Status: She is alert and oriented to person, place, and time.  Psychiatric:        Mood and Affect: Mood normal.        Behavior: Behavior normal.        Thought Content: Thought content normal.     No results found for any visits on 03/01/24.    Assessment & Plan:    Medical Pre-Op Clearance  Immunization History  Administered Date(s) Administered   Influenza, Seasonal, Injecte, Preservative Fre 08/29/2014   Tdap 08/29/2014, 06/18/2015   Health Maintenance  Topic Date Due   Hepatitis C Screening  Never done   Cervical Cancer Screening (HPV/Pap Cotest)  04/27/2021   INFLUENZA VACCINE  03/13/2024 (Originally 07/15/2023)   COVID-19 Vaccine (3 - 2024-25 season) 03/17/2024 (Originally 08/15/2023)   DTaP/Tdap/Td (3 - Td or Tdap) 06/17/2025   HIV Screening  Completed   HPV VACCINES  Aged Out   Problem List Items Addressed This Visit     Seasonal allergic rhinitis due to pollen   Reports nasal congestion and post nasal drainage. No fever, no swollen lymph nodes.  Advised to start allegra. Main also use Flonase and azelastine nasal spray if needed      Relevant Medications   fexofenadine (ALLEGRA ALLERGY) 180 MG tablet   fluticasone (FLONASE) 50 MCG/ACT nasal spray   azelastine (ASTELIN) 0.1 % nasal spray   Vitamin D deficiency   Relevant Orders   VITAMIN D 25 Hydroxy (Vit-D Deficiency, Fractures)   Other Visit Diagnoses       Preoperative clearance    -  Primary   Relevant Orders   CBC   Comprehensive metabolic panel   PTT   Protime-INR      Clearance form completed.  Return in about 1 year (around 03/01/2025) for CPE (fasting).     Alysia Penna, NP

## 2024-03-01 NOTE — Patient Instructions (Signed)
 Go to lab Maintain Heart healthy diet and daily exercise. Maintain current medications. Ok to proceed with surgery

## 2024-03-03 ENCOUNTER — Encounter: Payer: Self-pay | Admitting: Nurse Practitioner

## 2024-03-03 ENCOUNTER — Telehealth: Payer: Self-pay

## 2024-03-03 NOTE — Telephone Encounter (Signed)
 Lab results faxed to [phone].

## 2024-03-03 NOTE — Telephone Encounter (Signed)
 LVM for pt to start an OTC vit D supplement 1000IU, 1 tab daily and that her INR was normal. Lab results have been faxed to the surgeon.

## 2024-03-03 NOTE — Telephone Encounter (Signed)
 Copied from CRM [id number]. Topic: General - Other >> Mar 03, 2024  9:47 AM Eunice Blase wrote: Reason for CRM: Pt called requesting a call back regarding labs specify vit D results, and  INR what is it? Please call pt at [phone].

## 2024-03-07 ENCOUNTER — Other Ambulatory Visit: Payer: Self-pay | Admitting: Family Medicine

## 2024-03-07 DIAGNOSIS — M79605 Pain in left leg: Secondary | ICD-10-CM

## 2024-03-07 DIAGNOSIS — N9489 Other specified conditions associated with female genital organs and menstrual cycle: Secondary | ICD-10-CM

## 2024-03-10 NOTE — Telephone Encounter (Signed)
 My Chart message sent

## 2024-03-22 ENCOUNTER — Ambulatory Visit
Admission: RE | Admit: 2024-03-22 | Discharge: 2024-03-22 | Disposition: A | Discharge status: Home / Self Care | Source: Ambulatory Visit | Attending: Family Medicine | Admitting: Family Medicine

## 2024-03-22 DIAGNOSIS — N9489 Other specified conditions associated with female genital organs and menstrual cycle: Secondary | ICD-10-CM

## 2024-03-22 DIAGNOSIS — M79605 Pain in left leg: Secondary | ICD-10-CM

## 2024-03-22 NOTE — Consult Note (Signed)
 Chief Complaint:  Painful symptomatic left labial varicose vein postpartum  Referring Physician(s): Greenberg,Brent  History of Present Illness: Linda Huber is a 33 y.o. female who presents for evaluation of left labial varicosities.  She states that these have slowly developed after her pregnancies.  She now experiences localized left labial pain and discomfort which is progressive throughout the day.  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  The symptoms slowly developed and extend along the posterior thigh, knee and calf area.  Review of her venous history was performed.  She has had no prior treatments for varicose veins.  No history of vein removal, injection, stripping, or vein ablation.  No prior history of DVT.  She does have a family history of varicose veins and spider veins.  She currently is not wearing any compression stockings.  Her pain is managed with Advil 600 mg every 8 hours as needed.  No episodes of spontaneous bleeding, skin breakdown or ulceration.  Past Medical History:  Diagnosis Date   Depression    not on meds, doing good   Diabetes mellitus without complication (HCC)    Eczema    Infection    UTI   Urinary tract infection     Past Surgical History:  Procedure Laterality Date   BIOPSY BREAST     WISDOM TOOTH EXTRACTION      Allergies: Patient has no known allergies.  Medications: Prior to Admission medications   Medication Sig Start Date End Date Taking? Authorizing Provider  azelastine (ASTELIN) 0.1 % nasal spray Place 1 spray into both nostrils 2 (two) times daily. Use in each nostril as directed 03/01/24   Nche, Bonna Gains, NP  fexofenadine (ALLEGRA ALLERGY) 180 MG tablet Take 1 tablet (180 mg total) by mouth daily. 03/01/24   Nche, Bonna Gains, NP  fluticasone (FLONASE) 50 MCG/ACT nasal spray Place 2 sprays into both nostrils daily. 03/01/24   Nche, Bonna Gains, NP   ibuprofen (ADVIL) 600 MG tablet Take 1 tablet (600 mg total) by mouth every 8 (eight) hours as needed. With food 09/16/23   Nche, Bonna Gains, NP     Family History  Problem Relation Age of Onset   Leukemia Paternal Grandmother    Sickle cell anemia Paternal Grandmother    Stroke Maternal Grandfather    Hyperlipidemia Father    Asthma Mother    Hypertension Mother    Allergic rhinitis Mother    Urticaria Mother    Eczema Daughter    Cancer Neg Hx    Heart disease Neg Hx     Social History   Socioeconomic History   Marital status: Single    Spouse name: Not on file   Number of children: 3   Years of education: Not on file   Highest education level: Not on file  Occupational History   Not on file  Tobacco Use   Smoking status: Never   Smokeless tobacco: Never  Vaping Use   Vaping status: Never Used  Substance and Sexual Activity   Alcohol use: Yes    Comment: not with + preg   Drug use: No   Sexual activity: Yes    Birth control/protection: I.U.D.  Other Topics Concern   Not on file  Social History Narrative   Not on file   Social Drivers of Health   Financial Resource Strain: Not on file  Food Insecurity: Not on file  Transportation Needs: Not on file  Physical Activity: Not on file  Stress: Not on file  Social Connections: Unknown (08/12/2022)   Received from [facility], [facility]   Social Network    Social Network: Not on file       Review of Systems: A 12 point ROS discussed and pertinent positives are indicated in the HPI above.  All other systems are negative.  Review of Systems  Vital Signs: There were no vitals taken for this visit.    Physical Exam Constitutional:      General: She is not in acute distress.    Appearance: She is normal weight. She is not toxic-appearing.  Skin:    General: Skin is warm and dry.     Coloration: Skin is not jaundiced.     Findings: No bruising.     Comments: Small visible subsurface varicosity  without signs of phlebitis or thrombus extends from the perineal area along the posterior thigh, knee and calf.  There are additional scattered spider veins.  No skin lesions.  Negative for peripheral edema.  Neurological:     General: No focal deficit present.     Mental Status: Mental status is at baseline.  Psychiatric:        Mood and Affect: Mood normal.        Thought Content: Thought content normal.          Imaging: US Venous Img Lower Unilateral Left (DVT) Result Date: 03/22/2024 CLINICAL DATA:  Painful left labial and posterior leg varicosities postpartum EXAM: LEFT LOWER EXTREMITY VENOUS DOPPLER ULTRASOUND TECHNIQUE: Gray-scale sonography with graded compression, as well as color Doppler and duplex ultrasound were performed to evaluate the lower extremity deep venous systems from the level of the common femoral vein and including the common femoral, femoral, profunda femoral, popliteal and calf veins including the posterior tibial, peroneal and gastrocnemius veins when visible. The superficial great saphenous vein was also interrogated. Spectral Doppler was utilized to evaluate flow at rest and with distal augmentation maneuvers in the common femoral, femoral and popliteal veins. COMPARISON:  None Available. FINDINGS: Contralateral Common Femoral Vein: Respiratory phasicity is normal and symmetric with the symptomatic side. No evidence of thrombus. Normal compressibility. Common Femoral Vein: No evidence of thrombus. Normal compressibility, respiratory phasicity and response to augmentation. Saphenofemoral Junction: No evidence of thrombus. Normal compressibility and flow on color Doppler imaging. No significant venous insufficiency or reflux Profunda Femoral Vein: No evidence of thrombus. Normal compressibility and flow on color Doppler imaging. Femoral Vein: No evidence of thrombus. Normal compressibility, respiratory phasicity and response to augmentation. Popliteal Vein: No evidence  of thrombus. Normal compressibility, respiratory phasicity and response to augmentation. Calf Veins: No evidence of thrombus. Normal compressibility and flow on color Doppler imaging. Superficial Great Saphenous Vein: No evidence of thrombus. Normal compressibility. No significant venous insufficiency or reflux. Small saphenous vein: No significant venous insufficiency or reflux. Venous Reflux:  None. Other Findings: Additional scanning performed of the left labial area of concern. This correlates with a prominent superficial subcutaneous dilated labial vein that is compressible measuring up to 4.4 mm in diameter. This tortuous mild superficial labial varicosity courses posteriorly along the lower leg. Segments are visualized along the posterior thigh, knee and calf. Within the dilated superficial varicosity there is very minimal reflux of less than 2 seconds detectable. No associated superficial thrombosis or thrombophlebitis. No significant straight segments that would be amenable to transcatheter laser occlusion. IMPRESSION: 1. No  evidence of deep venous thrombosis. 2. No significant saphenous venous insufficiency or reflux in the left lower extremity. 3. Left labial and posterior leg sub surface varicosities as above. These are the patient's symptomatic varicosities. Electronically Signed   By: Judie Petit.  Temperance Kelemen M.D.   On: 03/22/2024 13:01   [place] RAD EVAL AND MGMT Result Date: 03/22/2024 Please refer to "Notes" to see consult details.   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  She continues to experience daily eft labial pain and discomfort while working and standing most of the day.  Pain now extends along the small varicosity in the perineal area, posterior thigh, knee and calf.  No associated detectable saphenous venous insufficiency or reflux as a contributing factor.  No evidence of DVT by ultrasound performed today.  Plan: Schedule for CT abdomen pelvis venogram with contrast to assess for any contributing pelvic venous insufficiency, venous abnormality, and also to assess the ovarian veins for pelvic congestion syndrome.  Following CT venogram, outpatient follow-up will be scheduled with my partner Dr. [name] who has performed ultrasound sclerotherapy previously on symptomatic labial varicosities.  The patient and Dr. Fredia Sorrow are in agreement with this plan.  Patient was also given a prescription for prescription strength pantyhose compression stockings.  Thank you for this interesting consult.  I greatly enjoyed meeting Linda Huber and look forward to participating in their care.  A copy of this report was sent to the requesting provider on this date.  Electronically Signed: Berdine Dance 03/22/2024, 1:20 PM   I spent a total of  60 Minutes   in face to face in clinical consultation, greater than 50% of which was counseling/coordinating care for This patient with symptomatic painful left labial varicosities

## 2024-03-24 ENCOUNTER — Other Ambulatory Visit: Payer: Self-pay | Admitting: Interventional Radiology

## 2024-03-24 DIAGNOSIS — M79605 Pain in left leg: Secondary | ICD-10-CM

## 2024-03-24 DIAGNOSIS — N9489 Other specified conditions associated with female genital organs and menstrual cycle: Secondary | ICD-10-CM

## 2024-04-03 ENCOUNTER — Telehealth: Admitting: Emergency Medicine

## 2024-04-03 DIAGNOSIS — J301 Allergic rhinitis due to pollen: Secondary | ICD-10-CM

## 2024-04-03 DIAGNOSIS — R062 Wheezing: Secondary | ICD-10-CM | POA: Diagnosis not present

## 2024-04-03 MED ORDER — METHYLPREDNISOLONE 4 MG PO TBPK: ORAL_TABLET | ORAL | 0 refills | Status: AC

## 2024-04-03 MED ORDER — ALBUTEROL SULFATE HFA 108 (90 BASE) MCG/ACT IN AERS: 2.0000 | INHALATION_SPRAY | RESPIRATORY_TRACT | 0 refills | Status: AC | PRN

## 2024-04-03 NOTE — Patient Instructions (Signed)
  [name], thank you for joining Sherel Dikes, PA-C for today's virtual visit.  While this provider is not your primary care provider (PCP), if your PCP is located in our provider database this encounter information will be shared with them immediately following your visit.   A [facility] MyChart account gives you access to today's visit and all your visits, tests, and labs performed at [facility] " click here if you don't have a [facility] MyChart account or go to [email or web address]  Consent: (Patient) Linda Huber provided verbal consent for this virtual visit at the beginning of the encounter.  Current Medications:  Current Outpatient Medications:    albuterol  (VENTOLIN  HFA) 108 (90 Base) MCG/ACT inhaler, Inhale 2 puffs into the lungs every 4 (four) hours as needed for wheezing or shortness of breath., Disp: 1 each, Rfl: 0   methylPREDNISolone  (MEDROL  DOSEPAK) 4 MG TBPK tablet, Take as directed on the package, Disp: 1 each, Rfl: 0   azelastine  (ASTELIN ) 0.1 % nasal spray, Place 1 spray into both nostrils 2 (two) times daily. Use in each nostril as directed, Disp: 30 mL, Rfl: 12   fexofenadine  (ALLEGRA  ALLERGY) 180 MG tablet, Take 1 tablet (180 mg total) by mouth daily., Disp: , Rfl:    fluticasone  (FLONASE ) 50 MCG/ACT nasal spray, Place 2 sprays into both nostrils daily., Disp: 16 g, Rfl: 0   ibuprofen  (ADVIL ) 600 MG tablet, Take 1 tablet (600 mg total) by mouth every 8 (eight) hours as needed. With food, Disp: 30 tablet, Rfl: 0   Medications ordered in this encounter:  Meds ordered this encounter  Medications   methylPREDNISolone  (MEDROL  DOSEPAK) 4 MG TBPK tablet    Sig: Take as directed on the package    Dispense:  1 each    Refill:  0    Supervising Provider:   Corine Dice [[id number]]   albuterol  (VENTOLIN  HFA) 108 (90 Base) MCG/ACT inhaler    Sig: Inhale 2 puffs into the lungs every 4 (four) hours as needed for wheezing or shortness of breath.     Dispense:  1 each    Refill:  0    Supervising Provider:   Corine Dice [id number]     *If you need refills on other medications prior to your next appointment, please contact your pharmacy*  Follow-Up: Call back or seek an in-person evaluation if the symptoms worsen or if the condition fails to improve as anticipated.  [facility] [phone]  Other Instructions    If you have been instructed to have an in-person evaluation today at a local Urgent Care facility, please use the link below. It will take you to a list of all of our available [facility], including address, phone number and hours of operation. Please do not delay care.  [facility]  If you or a family member do not have a primary care provider, use the link below to schedule a visit and establish care. When you choose a [facility] primary care physician or advanced practice provider, you gain a long-term partner in health. Find a Primary Care Provider  Learn more about [facility] in-office and virtual care options:  - Get Care Now

## 2024-04-03 NOTE — Progress Notes (Signed)
 Virtual Visit Consent   Linda Huber, you are scheduled for a virtual visit with a [facility] provider today. Just as with appointments in the office, your consent must be obtained to participate. Your consent will be active for this visit and any virtual visit you may have with one of our providers in the next 365 days. If you have a MyChart account, a copy of this consent can be sent to you electronically.  As this is a virtual visit, video technology does not allow for your provider to perform a traditional examination. This may limit your provider's ability to fully assess your condition. If your provider identifies any concerns that need to be evaluated in person or the need to arrange testing (such as labs, EKG, etc.), we will make arrangements to do so. Although advances in technology are sophisticated, we cannot ensure that it will always work on either your end or our end. If the connection with a video visit is poor, the visit may have to be switched to a telephone visit. With either a video or telephone visit, we are not always able to ensure that we have a secure connection.  By engaging in this virtual visit, you consent to the provision of healthcare and authorize for your insurance to be billed (if applicable) for the services provided during this visit. Depending on your insurance coverage, you may receive a charge related to this service.  I need to obtain your verbal consent now. Are you willing to proceed with your visit today? Linda Huber has provided verbal consent on 04/03/2024 for a virtual visit (video or telephone). Sherel Dikes, PA-C  Date: 04/03/2024 11:53 AM   Virtual Visit via Video Note   I, Sherel Dikes, connected with  Linda Huber  ([id number], 1991/04/11) on 04/03/24 at 11:45 AM EDT by a video-enabled telemedicine application and verified that I am speaking with the correct person using two identifiers.  Location: Patient: Virtual Visit Location Patient:  Home Provider: Virtual Visit Location Provider: Home Office   I discussed the limitations of evaluation and management by telemedicine and the availability of in person appointments. The patient expressed understanding and agreed to proceed.    History of Present Illness: Linda Huber is a 33 y.o. who identifies as a female who was assigned female at birth, and is being seen today for allergies.  States that she has been having severe seasonal allergies.  States that she is having wheezing, watery eyes and nose.  States that it is keep her from sleeping.  She states that she normally takes Allegra  and flonase .  Denies hx of asthma, but does get wheezing from allergies.  States that she has taken a steroid in the past that worked well.  She has used an inhaler in the past, but doesn't have one now.    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   Intrauterine contraceptive device 11/22/2019   Ganglion of right wrist 01/12/2019   Seasonal allergic rhinitis due to pollen 04/13/2018   Anemia 02/19/2017   Chronic constipation 02/19/2017   Sickle cell trait (HCC) 11/09/2014   Depressive disorder 11/09/2014   Abnormal cervical Papanicolaou smear 07/29/2009    Allergies: No Known Allergies Medications:  Current Outpatient Medications:    albuterol  (VENTOLIN  HFA) 108 (90 Base) MCG/ACT inhaler, Inhale  2 puffs into the lungs every 4 (four) hours as needed for wheezing or shortness of breath., Disp: 1 each, Rfl: 0   methylPREDNISolone  (MEDROL  DOSEPAK) 4 MG TBPK tablet, Take as directed on the package, Disp: 1 each, Rfl: 0   azelastine  (ASTELIN ) 0.1 % nasal spray, Place 1 spray into both nostrils 2 (two) times daily. Use in each nostril as directed, Disp: 30 mL, Rfl: 12   fexofenadine  (ALLEGRA  ALLERGY) 180 MG tablet, Take 1 tablet (180 mg  total) by mouth daily., Disp: , Rfl:    fluticasone  (FLONASE ) 50 MCG/ACT nasal spray, Place 2 sprays into both nostrils daily., Disp: 16 g, Rfl: 0   ibuprofen  (ADVIL ) 600 MG tablet, Take 1 tablet (600 mg total) by mouth every 8 (eight) hours as needed. With food, Disp: 30 tablet, Rfl: 0  Observations/Objective: Patient is well-developed, well-nourished in no acute distress.  Resting comfortably at home.  Head is normocephalic, atraumatic.  No labored breathing.  Speech is clear and coherent with logical content.  Patient is alert and oriented at baseline.    Assessment and Plan: 1. Seasonal allergic rhinitis due to pollen (Primary)  2. Wheezing   Meds ordered this encounter  Medications   methylPREDNISolone  (MEDROL  DOSEPAK) 4 MG TBPK tablet    Sig: Take as directed on the package    Dispense:  1 each    Refill:  0    Supervising Provider:   Corine Dice [[id number]]   albuterol  (VENTOLIN  HFA) 108 (90 Base) MCG/ACT inhaler    Sig: Inhale 2 puffs into the lungs every 4 (four) hours as needed for wheezing or shortness of breath.    Dispense:  1 each    Refill:  0    Supervising Provider:   Corine Dice [id number]   Patient seen for exacerbation of seasonal allergies.  Has some wheezing.  Will rx above meds to help with wheezing.  Advised continuing OTC antihistamines and eye drops and nose sprays.  Follow Up Instructions: I discussed the assessment and treatment plan with the patient. The patient was provided an opportunity to ask questions and all were answered. The patient agreed with the plan and demonstrated an understanding of the instructions.  A copy of instructions were sent to the patient via MyChart unless otherwise noted below.     The patient was advised to call back or seek an in-person evaluation if the symptoms worsen or if the condition fails to improve as anticipated.    Sherel Dikes, PA-C

## 2024-04-05 ENCOUNTER — Encounter: Payer: Self-pay | Admitting: Interventional Radiology

## 2024-04-06 ENCOUNTER — Ambulatory Visit
Admission: RE | Admit: 2024-04-06 | Discharge: 2024-04-06 | Discharge status: Home / Self Care | Source: Ambulatory Visit | Attending: Interventional Radiology | Admitting: Interventional Radiology

## 2024-04-06 DIAGNOSIS — N9489 Other specified conditions associated with female genital organs and menstrual cycle: Secondary | ICD-10-CM

## 2024-04-06 DIAGNOSIS — M79605 Pain in left leg: Secondary | ICD-10-CM

## 2024-04-06 MED ORDER — IOPAMIDOL (ISOVUE-300) INJECTION 61%
100.0000 mL | Freq: Once | INTRAVENOUS | Status: AC | PRN
  Administered 2024-04-06: 100 mL via INTRAVENOUS

## 2024-04-11 ENCOUNTER — Other Ambulatory Visit: Payer: Self-pay | Admitting: Interventional Radiology

## 2024-04-11 DIAGNOSIS — N9489 Other specified conditions associated with female genital organs and menstrual cycle: Secondary | ICD-10-CM

## 2024-04-11 DIAGNOSIS — M79605 Pain in left leg: Secondary | ICD-10-CM

## 2024-04-12 ENCOUNTER — Other Ambulatory Visit: Payer: Self-pay | Admitting: Interventional Radiology

## 2024-04-12 DIAGNOSIS — N9489 Other specified conditions associated with female genital organs and menstrual cycle: Secondary | ICD-10-CM

## 2024-04-12 DIAGNOSIS — M79605 Pain in left leg: Secondary | ICD-10-CM

## 2024-04-17 ENCOUNTER — Ambulatory Visit
Admission: RE | Admit: 2024-04-17 | Discharge: 2024-04-17 | Disposition: A | Discharge status: Home / Self Care | Source: Ambulatory Visit | Attending: Interventional Radiology | Admitting: Interventional Radiology

## 2024-04-17 DIAGNOSIS — N9489 Other specified conditions associated with female genital organs and menstrual cycle: Secondary | ICD-10-CM

## 2024-04-17 DIAGNOSIS — M79605 Pain in left leg: Secondary | ICD-10-CM

## 2024-04-17 HISTORY — PX: IR RADIOLOGIST EVAL & MGMT: IMG5224

## 2024-04-17 NOTE — Progress Notes (Signed)
 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  History of Present Illness: Linda Huber is a 33 y.o. female referred by my partner, Dr. Alyssa Jumper for treatment of symptomatic left labial varicosities. A CT venogram of the abdomen and pelvis was ordered at the time of initial consultation and she is scheduled for sclerotherapy on 05/04/24. She had some questions and requested a phone telehealth follow up today.  Past Medical History:  Diagnosis Date   Depression    not on meds, doing good   Diabetes mellitus without complication (HCC)    Eczema    Infection    UTI   Urinary tract infection     Past Surgical History:  Procedure Laterality Date   BIOPSY BREAST     WISDOM TOOTH EXTRACTION      Allergies: Patient has no known allergies.  Medications: Prior to Admission medications   Medication Sig Start Date End Date Taking? Authorizing Provider  albuterol  (VENTOLIN  HFA) 108 (90 Base) MCG/ACT inhaler Inhale 2 puffs into the lungs every 4 (four) hours as needed for wheezing or shortness of breath. 04/03/24   Sherel Dikes, PA-C  azelastine  (ASTELIN ) 0.1 % nasal spray Place 1 spray into both nostrils 2 (two) times daily. Use in each nostril as directed 03/01/24   Nche, Connye Delaine, NP  fexofenadine  (ALLEGRA  ALLERGY) 180 MG tablet Take 1 tablet (180 mg total) by mouth daily. 03/01/24   Nche, Charlotte Lum, NP  fluticasone  (FLONASE ) 50 MCG/ACT nasal spray Place 2 sprays into both nostrils daily. 03/01/24   Nche, Connye Delaine, NP  ibuprofen  (ADVIL ) 600 MG tablet Take 1 tablet (600 mg total) by mouth every 8 (eight) hours as needed. With food 09/16/23   Nche, Connye Delaine, NP  methylPREDNISolone  (MEDROL  DOSEPAK) 4 MG TBPK tablet Take as directed on the package 04/03/24   Sherel Dikes, PA-C     Family History  Problem Relation Age of Onset   Leukemia Paternal Grandmother    Sickle cell anemia  Paternal Grandmother    Stroke Maternal Grandfather    Hyperlipidemia Father    Asthma Mother    Hypertension Mother    Allergic rhinitis Mother    Urticaria Mother    Eczema Daughter    Cancer Neg Hx    Heart disease Neg Hx     Social History   Socioeconomic History   Marital status: Single    Spouse name: Not on file   Number of children: 3   Years of education: Not on file   Highest education level: Not on file  Occupational History   Not on file  Tobacco Use   Smoking status: Never   Smokeless tobacco: Never  Vaping Use   Vaping status: Never Used  Substance and Sexual Activity   Alcohol use: Yes    Comment: not with + preg   Drug use: No   Sexual activity: Yes    Birth control/protection: I.U.D.  Other Topics Concern   Not on file  Social History Narrative   Not on file   Social Drivers of Health   Financial Resource Strain: Not on file  Food Insecurity: Not on file  Transportation Needs: Not on file  Physical Activity: Not on file  Stress: Not on file  Social Connections: Unknown (08/12/2022)   Received from [facility], [facility]   Social Network    Social Network: Not on file    Review of Systems  Respiratory: Negative.    Cardiovascular: Negative.   Gastrointestinal: Negative.   Genitourinary:        Pain at the level of left labial and medial proximal thigh varicosities especially after prolonged standing.  Musculoskeletal: Negative.   Neurological: Negative.     Review of Systems: A 12 point ROS discussed and pertinent positives are indicated in the HPI above.  All other systems are negative.   Physical Exam No direct physical exam was performed (except for noted visual exam findings with Video Visits).   Vital Signs: There were no vitals taken for this visit.  Imaging: CT VENOGRAM ABD/PEL Result Date: 04/13/2024 EXAMINATION: CT VENOGRAM ABDOMEN PELVIS CLINICAL INDICATION: Female, 33 years old. Left labial pain TECHNIQUE: Axial  CTA of the abdomen and pelvis with 100 cc Isovue  300 intravenous contrast. Multiplanar and 3D reformations provided. Unless otherwise specified, incidental thyroid , adrenal, renal lesions do not require dedicated imaging follow up. Additionally, any mentioned pulmonary nodules do not require dedicated imaging follow-up based on the Fleischner guidelines unless otherwise specified. Coronary calcifications are not identified unless otherwise specified. COMPARISON: FINDINGS: The IVC as well as bilateral common, internal and external iliac veins appear patent. The lung bases are clear. The heart is normal in size. The liver appears normal. The gallbladder is normal. The spleen is normal. The pancreas is normal. The adrenals are normal. Kidneys are normal. The abdominal aorta is normal in caliber. The bladder is normal. There is an IUD within the uterus. Appendix is normal. Large and small bowel loops are otherwise within normal limits. No free fluid or adenopathy. Bones are normal. IMPRESSION: No acute findings. Patent IVC and iliac veins. DOSE REDUCTION: This exam was performed according to our departmental dose-optimization program which includes automated exposure control, adjustment of the mA and/or kV according to patient size and/or use of iterative reconstruction technique. Electronically signed by: [place] Engel MD 04/13/2024 01:59 PM EDT RP Workstation: [id number]   US  RAD EVAL AND MGMT Result Date: 03/22/2024 : Please refer to the dictated note in [facility] EPIC from today for this IR rad eval and management. Electronically Signed   By: Melven Stable.  Shick M.D.   On: 03/22/2024 13:06   US  Venous Img Lower Unilateral Left (DVT) Result Date: 03/22/2024 CLINICAL DATA:  Painful left labial and posterior leg varicosities postpartum EXAM: LEFT LOWER EXTREMITY VENOUS DOPPLER ULTRASOUND TECHNIQUE: Gray-scale sonography with graded compression, as well as color Doppler and duplex ultrasound were performed to evaluate the lower  extremity deep venous systems from the level of the common femoral vein and including the common femoral, femoral, profunda femoral, popliteal and calf veins including the posterior tibial, peroneal and gastrocnemius veins when visible. The superficial great saphenous vein was also interrogated. Spectral Doppler was utilized to evaluate flow at rest and with distal augmentation maneuvers in the common femoral, femoral and popliteal veins. COMPARISON:  None Available. FINDINGS: Contralateral Common Femoral Vein: Respiratory phasicity is normal and symmetric with the symptomatic side. No evidence of thrombus. Normal compressibility. Common Femoral Vein: No evidence of thrombus. Normal compressibility, respiratory phasicity and response to augmentation. Saphenofemoral Junction: No evidence of thrombus. Normal compressibility and flow on color Doppler imaging. No significant venous insufficiency or reflux Profunda Femoral Vein: No evidence of thrombus. Normal compressibility and flow on color Doppler imaging. Femoral Vein: No evidence of thrombus. Normal compressibility, respiratory phasicity and response to augmentation. Popliteal Vein: No evidence of thrombus. Normal compressibility, respiratory phasicity and response to augmentation. Calf Veins: No evidence of thrombus. Normal compressibility  and flow on color Doppler imaging. Superficial Great Saphenous Vein: No evidence of thrombus. Normal compressibility. No significant venous insufficiency or reflux. Small saphenous vein: No significant venous insufficiency or reflux. Venous Reflux:  None. Other Findings: Additional scanning performed of the left labial area of concern. This correlates with a prominent superficial subcutaneous dilated labial vein that is compressible measuring up to 4.4 mm in diameter. This tortuous mild superficial labial varicosity courses posteriorly along the lower leg. Segments are visualized along the posterior thigh, knee and calf. Within  the dilated superficial varicosity there is very minimal reflux of less than 2 seconds detectable. No associated superficial thrombosis or thrombophlebitis. No significant straight segments that would be amenable to transcatheter laser occlusion. IMPRESSION: 1. No evidence of deep venous thrombosis. 2. No significant saphenous venous insufficiency or reflux in the left lower extremity. 3. Left labial and posterior leg sub surface varicosities as above. These are the patient's symptomatic varicosities. Electronically Signed   By: Melven Stable.  Shick M.D.   On: 03/22/2024 13:01    Labs:  CBC: Recent Labs    03/01/24 1425  WBC 8.8  HGB 13.8  HCT 40.9  PLT 252.0    COAGS: Recent Labs    03/01/24 1418 03/01/24 1425  INR 1.1*  --   APTT  --  26.2    BMP: Recent Labs    03/01/24 1425  NA 138  K 4.3  CL 102  CO2 28  GLUCOSE 89  BUN 12  CALCIUM 10.1  CREATININE 0.73    LIVER FUNCTION TESTS: Recent Labs    03/01/24 1425  BILITOT 0.6  AST 16  ALT 12  ALKPHOS 56  PROT 8.1  ALBUMIN 4.9     Assessment and Plan:  I spoke with Ms. Ohnemus over the phone. I reviewed her CT venogram from April 24, which does not show evidence of ovarian vein enlargement or pelvic varicosities to suggest ovarian vein insufficiency or pelvic congestion syndrome. Left labial and medial proximal thigh varicosities appear to potentially communicate with some vaginal veins but do not otherwise appear to communicate with major internal iliac vein branches. No direct communication with the proximal GSV is visible by CT.   I answered questions related to sclerotherapy itself and also recommended Ms. Serafin bring some panty-style compression hose with her to wear after sclerotherapy and for a few weeks after to maximize effect and decrease risk of recanalization of varicosities after sclerotherapy. All of her questions were answered.   Electronically Signed: Aileen Alexanders 04/17/2024, 4:24 PM    I spent a  total of  10 Minutes in remote  clinical consultation, greater than 50% of which was counseling/coordinating care for left labial and proximal thigh varicosities.    Visit type: Audio only (telephone). Audio (no video) only due to patient's lack of internet/smartphone capability. Alternative for in-person consultation at [facility], [address], [place], [place]. This visit type was conducted due to national recommendations for restrictions regarding the COVID-19 Pandemic (e.g. social distancing).  This format is felt to be most appropriate for this patient at this time.  All issues noted in this document were discussed and addressed.

## 2024-04-24 ENCOUNTER — Telehealth: Admitting: Physician Assistant

## 2024-04-24 NOTE — Progress Notes (Signed)
 The patient no-showed for appointment despite this provider sending direct link, reaching out via phone with no response and waiting for at least 10 minutes from appointment time for patient to join. They will be marked as a NS for this appointment/time.  ? ?Piedad Climes, PA-C ? ? ? ?

## 2024-04-25 ENCOUNTER — Telehealth: Admitting: Family Medicine

## 2024-04-25 DIAGNOSIS — B3731 Acute candidiasis of vulva and vagina: Secondary | ICD-10-CM | POA: Diagnosis not present

## 2024-04-25 MED ORDER — FLUCONAZOLE 150 MG PO TABS: 150.0000 mg | ORAL_TABLET | Freq: Every day | ORAL | 0 refills | Status: AC

## 2024-04-25 NOTE — Progress Notes (Signed)
 Virtual Visit Consent   Linda Huber, you are scheduled for a virtual visit with a [facility] provider today. Just as with appointments in the office, your consent must be obtained to participate. Your consent will be active for this visit and any virtual visit you may have with one of our providers in the next 365 days. If you have a MyChart account, a copy of this consent can be sent to you electronically.  As this is a virtual visit, video technology does not allow for your provider to perform a traditional examination. This may limit your provider's ability to fully assess your condition. If your provider identifies any concerns that need to be evaluated in person or the need to arrange testing (such as labs, EKG, etc.), we will make arrangements to do so. Although advances in technology are sophisticated, we cannot ensure that it will always work on either your end or our end. If the connection with a video visit is poor, the visit may have to be switched to a telephone visit. With either a video or telephone visit, we are not always able to ensure that we have a secure connection.  By engaging in this virtual visit, you consent to the provision of healthcare and authorize for your insurance to be billed (if applicable) for the services provided during this visit. Depending on your insurance coverage, you may receive a charge related to this service.  I need to obtain your verbal consent now. Are you willing to proceed with your visit today? Linda Huber has provided verbal consent on 04/25/2024 for a virtual visit (video or telephone). Linda Pion, NP  Date: 04/25/2024 11:07 AM   Virtual Visit via Video Note   I, Linda Huber, connected with  Linda Huber  ([id number], 01-06-1991) on 04/25/24 at 11:00 AM EDT by a video-enabled telemedicine application and verified that I am speaking with the correct person using two identifiers.  Location: Patient: Virtual Visit Location Patient:  Home Provider: Virtual Visit Location Provider: Home Office   I discussed the limitations of evaluation and management by telemedicine and the availability of in person appointments. The patient expressed understanding and agreed to proceed.    History of Present Illness: Linda Huber is a 33 y.o. who identifies as a female who was assigned female at birth, and is being seen today for yeast infection.     HPI: Vaginal Discharge The patient's primary symptoms include genital itching and vaginal bleeding. The patient's pertinent negatives include no genital lesions, genital odor, genital rash, missed menses, pelvic pain or vaginal discharge. This is a new problem. The current episode started in the past 7 days. The problem occurs constantly. The problem has been unchanged. The patient is experiencing no pain. She is not pregnant. Pertinent negatives include no abdominal pain, anorexia, back pain, chills, constipation, diarrhea, discolored urine, dysuria, fever, flank pain, frequency, headaches, hematuria, joint pain, joint swelling, nausea, painful intercourse, rash, sore throat, urgency or vomiting. The vaginal discharge was white. There has been no bleeding. She is sexually active. No, her partner does not have an STD. She uses an IUD for contraception.    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1 % nasal spray, Place 1 spray into both nostrils 2 (two) times daily. Use in each nostril as directed, Disp: 30 mL, Rfl: 12   fexofenadine  (ALLEGRA  ALLERGY) 180 MG tablet, Take 1 tablet (180 mg total) by mouth daily., Disp: , Rfl:    fluticasone  (FLONASE ) 50 MCG/ACT nasal spray, Place 2 sprays into both nostrils daily., Disp: 16 g, Rfl: 0   ibuprofen  (ADVIL ) 600 MG tablet, Take 1 tablet (600 mg total) by mouth every 8 (eight) hours as needed. With food, Disp: 30 tablet, Rfl: 0   methylPREDNISolone  (MEDROL  DOSEPAK) 4 MG TBPK tablet, Take as directed on the package, Disp: 1 each, Rfl: 0  Observations/Objective: Patient is well-developed, well-nourished in no acute distress.  Resting comfortably  at home.  Head is normocephalic, atraumatic.  No labored breathing.  Speech is clear and coherent with logical content.  Patient is alert and oriented at baseline.    Assessment and Plan:  1. Yeast vaginitis (Primary)  - fluconazole  (DIFLUCAN ) 150 MG tablet; Take 1 tablet (150 mg total) by mouth daily.  Dispense: 1 tablet; Refill: 0   -follow up if not improving -info on yeast vag- on AVS    Reviewed side effects, risks and benefits of medication.    Patient acknowledged agreement and understanding of the plan.   Past Medical, Surgical, Social History, Allergies, and Medications have been Reviewed.    Follow Up Instructions: I discussed the assessment and treatment plan with the patient. The patient was provided an opportunity to ask questions and all were answered. The patient agreed with the plan and demonstrated an understanding of the instructions.  A copy of instructions were sent to the patient via MyChart unless otherwise noted below.    The patient was advised to call back or seek an in-person evaluation if the symptoms worsen  or if the condition fails to improve as anticipated.    Linda Pion, NP

## 2024-04-25 NOTE — Patient Instructions (Addendum)
 [name], thank you for joining Lanetta Pion, NP for today's virtual visit.  While this provider is not your primary care provider (PCP), if your PCP is located in our provider database this encounter information will be shared with them immediately following your visit.   A [facility] MyChart account gives you access to today's visit and all your visits, tests, and labs performed at [facility] " click here if you don't have a [facility] MyChart account or go to [email or web address]  Consent: (Patient) Linda Huber provided verbal consent for this virtual visit at the beginning of the encounter.  Current Medications:  Current Outpatient Medications:    fluconazole  (DIFLUCAN ) 150 MG tablet, Take 1 tablet (150 mg total) by mouth daily., Disp: 1 tablet, Rfl: 0   albuterol  (VENTOLIN  HFA) 108 (90 Base) MCG/ACT inhaler, Inhale 2 puffs into the lungs every 4 (four) hours as needed for wheezing or shortness of breath., Disp: 1 each, Rfl: 0   azelastine  (ASTELIN ) 0.1 % nasal spray, Place 1 spray into both nostrils 2 (two) times daily. Use in each nostril as directed, Disp: 30 mL, Rfl: 12   fexofenadine  (ALLEGRA  ALLERGY) 180 MG tablet, Take 1 tablet (180 mg total) by mouth daily., Disp: , Rfl:    fluticasone  (FLONASE ) 50 MCG/ACT nasal spray, Place 2 sprays into both nostrils daily., Disp: 16 g, Rfl: 0   ibuprofen  (ADVIL ) 600 MG tablet, Take 1 tablet (600 mg total) by mouth every 8 (eight) hours as needed. With food, Disp: 30 tablet, Rfl: 0   methylPREDNISolone  (MEDROL  DOSEPAK) 4 MG TBPK tablet, Take as directed on the package, Disp: 1 each, Rfl: 0   Medications ordered in this encounter:  Meds ordered this encounter  Medications   fluconazole  (DIFLUCAN ) 150 MG tablet    Sig: Take 1 tablet (150 mg total) by mouth daily.    Dispense:  1 tablet    Refill:  0    Supervising Provider:   LAMPTEY, PHILIP O [[id number]]     *If you need refills on other medications prior to your  next appointment, please contact your pharmacy*  Follow-Up: Call back or seek an in-person evaluation if the symptoms worsen or if the condition fails to improve as anticipated.  [facility] [phone]  Other Instructions Vaginal Yeast Infection, Adult  Vaginal yeast infection is a condition that causes vaginal discharge as well as soreness, swelling, and redness (inflammation) of the vagina. This is a common condition. Some women get this infection frequently. What are the causes? This condition is caused by a change in the normal balance of the yeast (Candida) and normal bacteria that live in the vagina. This change causes an overgrowth of yeast, which causes the inflammation. What increases the risk? The condition is more likely to develop in women who: Take antibiotic medicines. Have diabetes. Take birth control pills. Are pregnant. Douche often. Have a weak body defense system (immune system). Have been taking steroid medicines for a long time. Frequently wear tight clothing. What are the signs or symptoms? Symptoms of this condition include: White, thick, creamy vaginal discharge. Swelling, itching, redness, and irritation of the vagina. The lips of the vagina (labia) may be affected as well. Pain or a burning feeling while urinating. Pain during sex. How is this diagnosed? This condition is diagnosed based on: Your medical history. A physical exam. A pelvic exam. Your health care provider will examine a sample of your vaginal discharge under a microscope. Your  health care provider may send this sample for testing to confirm the diagnosis. How is this treated? This condition is treated with medicine. Medicines may be over-the-counter or prescription. You may be told to use one or more of the following: Medicine that is taken by mouth (orally). Medicine that is applied as a cream (topically). Medicine that is inserted directly into the vagina  (suppository). Follow these instructions at home: Take or apply over-the-counter and prescription medicines only as told by your health care provider. Do not use tampons until your health care provider approves. Do not have sex until your infection has cleared. Sex can prolong or worsen your symptoms of infection. Ask your health care provider when it is safe to resume sexual activity. Keep all follow-up visits. This is important. How is this prevented?  Do not wear tight clothes, such as pantyhose or tight pants. Wear breathable cotton underwear. Do not use douches, perfumed soap, creams, or powders. Wipe from front to back after using the toilet. If you have diabetes, keep your blood sugar levels under control. Ask your health care provider for other ways to prevent yeast infections. Contact a health care provider if: You have a fever. Your symptoms go away and then return. Your symptoms do not get better with treatment. Your symptoms get worse. You have new symptoms. You develop blisters in or around your vagina. You have blood coming from your vagina and it is not your menstrual period. You develop pain in your abdomen. Summary Vaginal yeast infection is a condition that causes discharge as well as soreness, swelling, and redness (inflammation) of the vagina. This condition is treated with medicine. Medicines may be over-the-counter or prescription. Take or apply over-the-counter and prescription medicines only as told by your health care provider. Do not douche. Resume sexual activity or use of tampons as instructed by your health care provider. Contact a health care provider if your symptoms do not get better with treatment or your symptoms go away and then return. This information is not intended to replace advice given to you by your health care provider. Make sure you discuss any questions you have with your health care provider. Document Revised: 02/17/2021 Document Reviewed:  02/17/2021 Elsevier Patient Education  2024 Elsevier Inc.   If you have been instructed to have an in-person evaluation today at a local Urgent Care facility, please use the link below. It will take you to a list of all of our available [facility], including address, phone number and hours of operation. Please do not delay care.  [facility]  If you or a family member do not have a primary care provider, use the link below to schedule a visit and establish care. When you choose a [facility] primary care physician or advanced practice provider, you gain a long-term partner in health. Find a Primary Care Provider  Learn more about [facility]'s in-office and virtual care options: [facility] - Get Care Now

## 2024-04-30 IMAGING — US US PELVIS COMPLETE WITH TRANSVAGINAL
1 series · 13 of 25 positions shown · non-contrast
Comparison: None available

CLINICAL DATA: Fluctuating LEFT labial mass, unknown LMP, has IUD

EXAM:
TRANSABDOMINAL AND TRANSVAGINAL ULTRASOUND OF PELVIS
TECHNIQUE: Both transabdominal and transvaginal ultrasound examinations of the
pelvis were performed. Transabdominal technique was performed for
global imaging of the pelvis including uterus, ovaries, adnexal
regions, and pelvic cul-de-sac. It was necessary to proceed with
endovaginal exam following the transabdominal exam to visualize the
endometrium and adnexa.

[Series 1: us pelvis complete with transvaginal · 0.20mm/px · 13 of 74 slices shown]
[im 1/74]
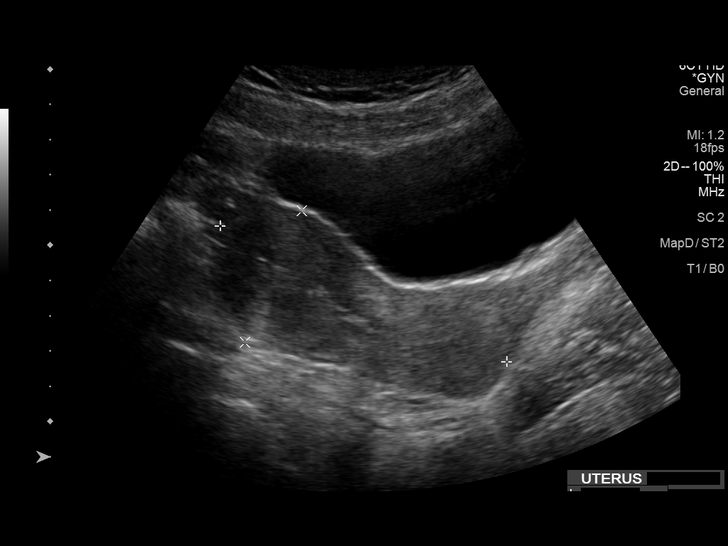
[im 7/74]
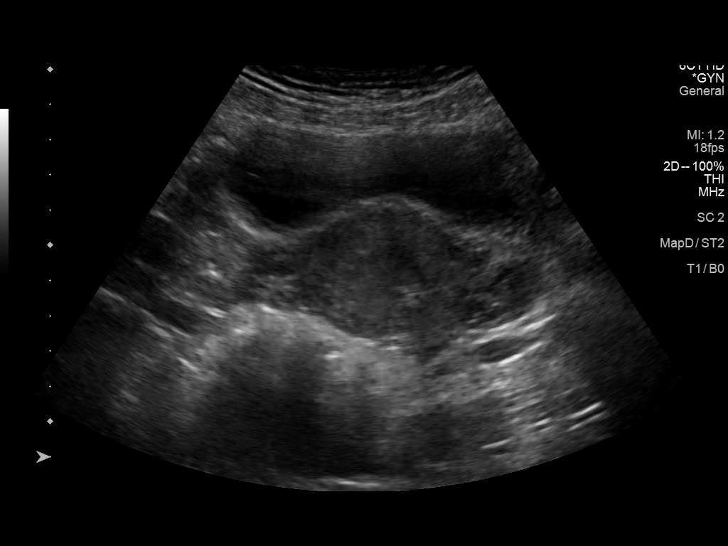
[im 13/74]
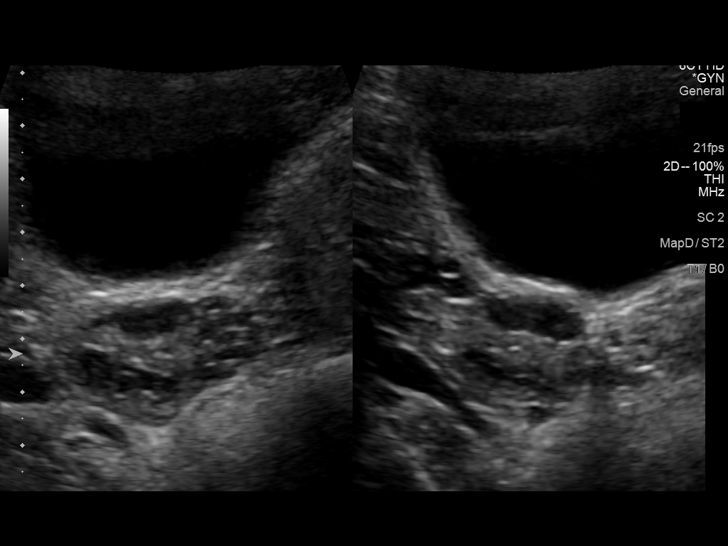
[im 19/74]
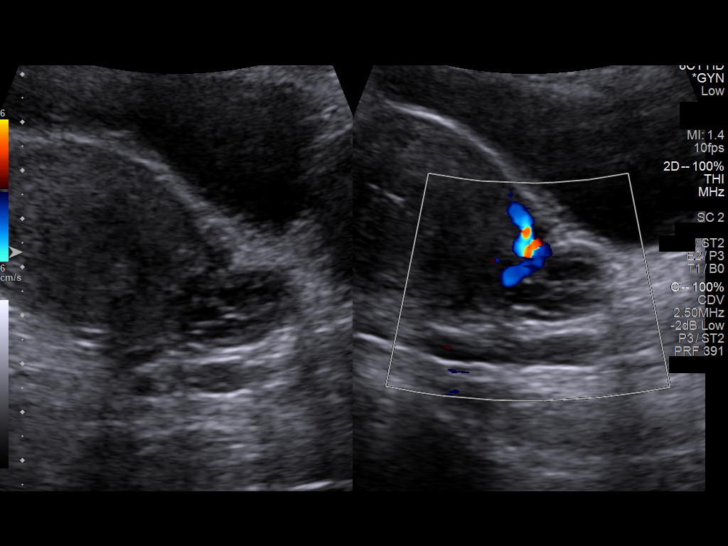
[im 25/74]
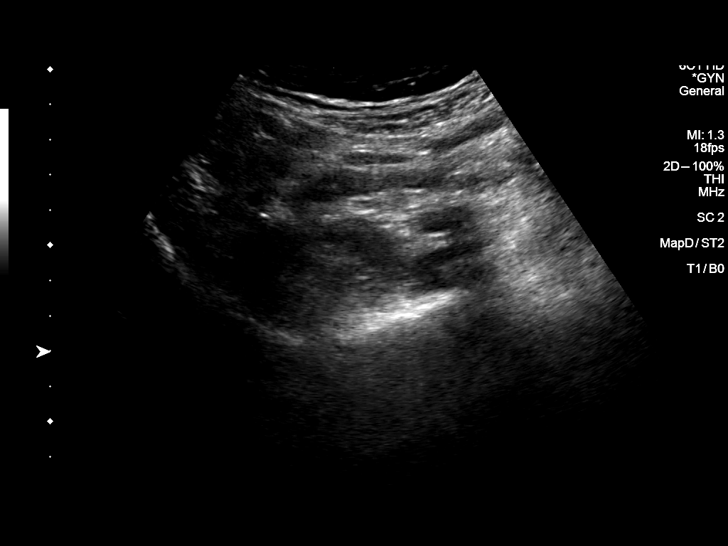
[im 31/74]
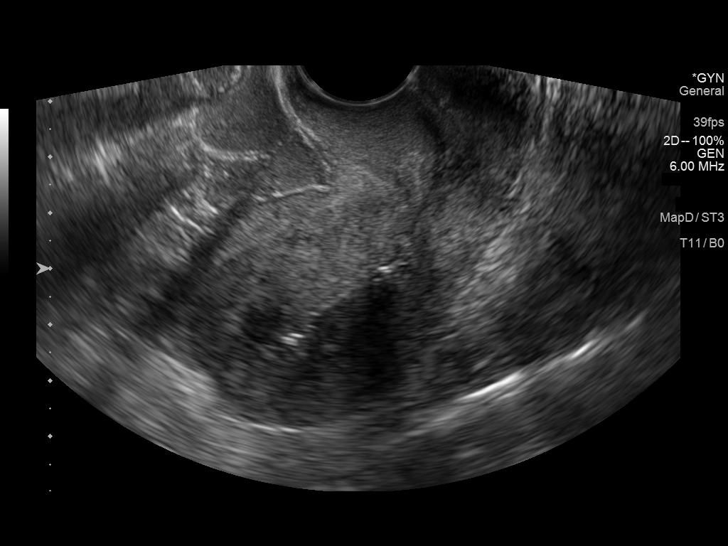
[im 37/74]
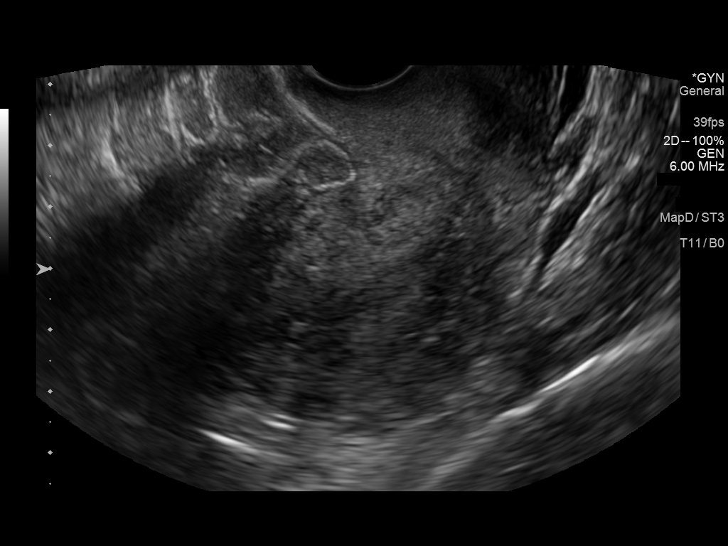
[im 43/74]
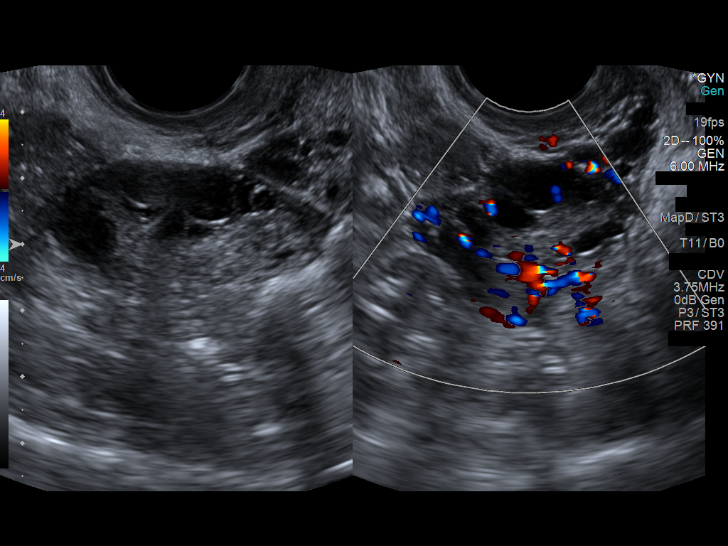
[im 49/74]
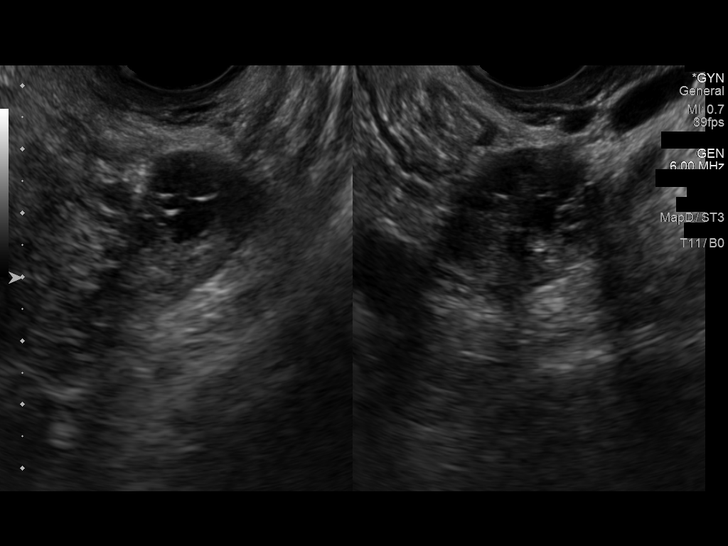
[im 55/74]
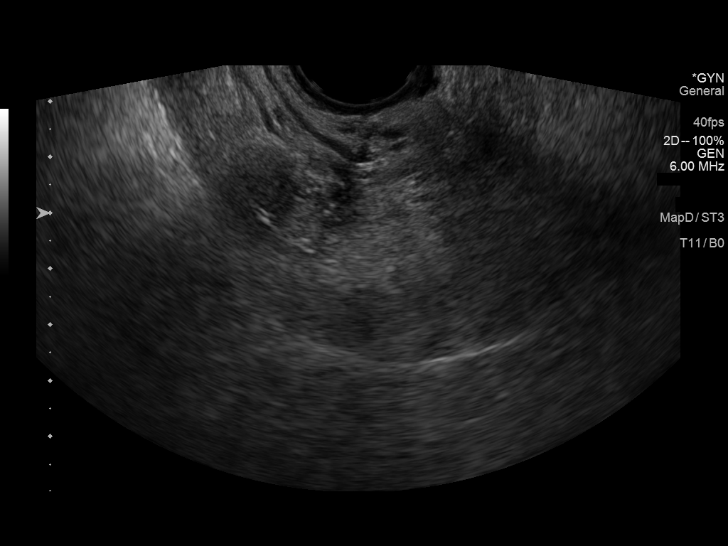
[im 61/74]
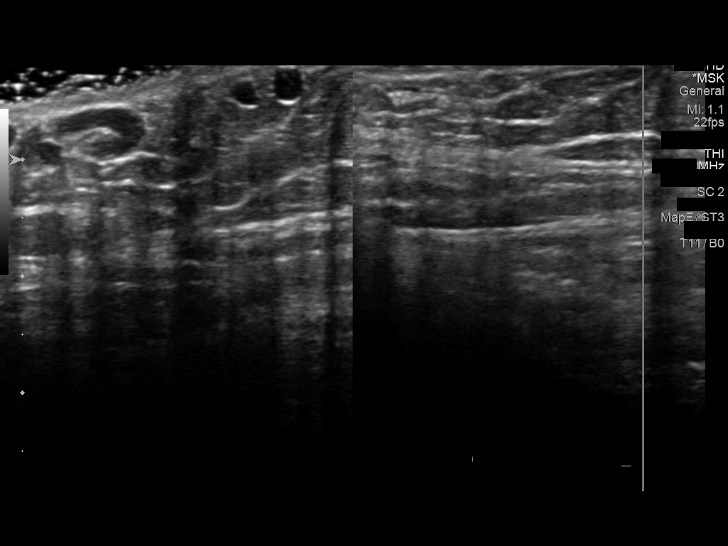
[im 67/74]
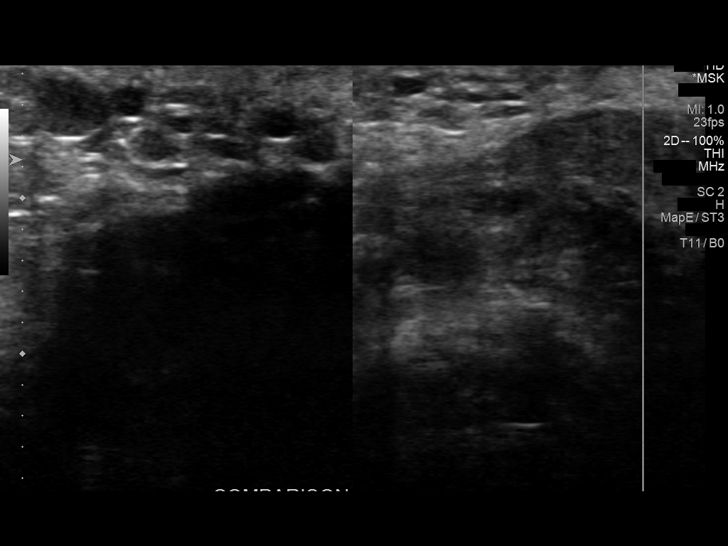
[im 74/74]
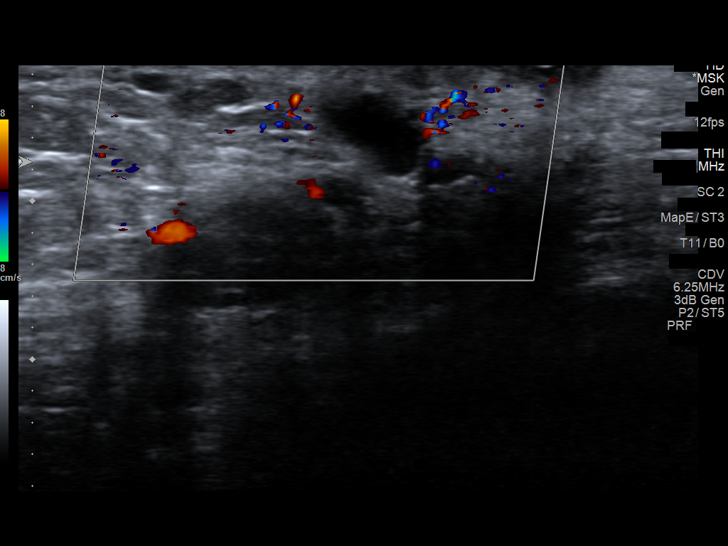

[13 of 25 positions shown; findings below may reference images not displayed]

FINDINGS: Uterus

Measurements: 9.0 x 4.1 x 5.4 cm = volume: 103 mL. Anteverted.
Normal morphology without mass

Endometrium

Thickness: 3 mm. IUD in expected position at upper uterine segment
endometrial canal. No endometrial fluid or mass

Right ovary

Measurements: 2.4 x 2.7 x 3.8 cm = volume: 13 mL. Normal morphology
without mass

Left ovary

Measurements: 2.8 x 1.7 x 2.7 cm = volume: 7 mL. Normal morphology
without mass

Other findings

No free pelvic fluid. No adnexal masses. Sonography of the labia
performed bilaterally. Multiple prominent subcutaneous vessels are
seen bilaterally. No discrete definite discrete LEFT labial mass is
identified.
IMPRESSION: IUD in expected position at the upper uterine segment endometrial
canal.

Otherwise normal appearance of uterus, endometrial complex and
ovaries.

No definite discrete LEFT labial mass identified.

## 2024-05-04 ENCOUNTER — Ambulatory Visit

## 2024-05-04 ENCOUNTER — Other Ambulatory Visit
